# Patient Record
Sex: Male | Born: 1960 | Race: White | Hispanic: No | Marital: Single | Smoking: Never smoker
Health system: Southern US, Community
[De-identification: ages and names within clinical notes are randomized; demographics above are authoritative.]

## PROBLEM LIST (undated history)

## (undated) DIAGNOSIS — R519 Headache, unspecified: Secondary | ICD-10-CM

## (undated) DIAGNOSIS — J309 Allergic rhinitis, unspecified: Secondary | ICD-10-CM

## (undated) DIAGNOSIS — E119 Type 2 diabetes mellitus without complications: Secondary | ICD-10-CM

## (undated) DIAGNOSIS — D62 Acute posthemorrhagic anemia: Secondary | ICD-10-CM

## (undated) DIAGNOSIS — K648 Other hemorrhoids: Secondary | ICD-10-CM

## (undated) DIAGNOSIS — IMO0001 Reserved for inherently not codable concepts without codable children: Secondary | ICD-10-CM

## (undated) DIAGNOSIS — K5730 Diverticulosis of large intestine without perforation or abscess without bleeding: Secondary | ICD-10-CM

## (undated) DIAGNOSIS — R739 Hyperglycemia, unspecified: Secondary | ICD-10-CM

## (undated) DIAGNOSIS — R51 Headache: Secondary | ICD-10-CM

## (undated) DIAGNOSIS — R030 Elevated blood-pressure reading, without diagnosis of hypertension: Secondary | ICD-10-CM

## (undated) HISTORY — DX: Acute posthemorrhagic anemia: D62

## (undated) HISTORY — DX: Elevated blood-pressure reading, without diagnosis of hypertension: R03.0

## (undated) HISTORY — DX: Other hemorrhoids: K64.8

## (undated) HISTORY — DX: Allergic rhinitis, unspecified: J30.9

## (undated) HISTORY — DX: Reserved for inherently not codable concepts without codable children: IMO0001

## (undated) HISTORY — DX: Type 2 diabetes mellitus without complications: E11.9

## (undated) HISTORY — DX: Hyperglycemia, unspecified: R73.9

## (undated) HISTORY — DX: Headache, unspecified: R51.9

## (undated) HISTORY — DX: Diverticulosis of large intestine without perforation or abscess without bleeding: K57.30

## (undated) HISTORY — DX: Headache: R51

## (undated) HISTORY — PX: ESOPHAGOGASTRODUODENOSCOPY: SHX1529

---

## 2006-07-01 HISTORY — PX: COLONOSCOPY: SHX174

## 2007-04-14 ENCOUNTER — Ambulatory Visit: Payer: Self-pay | Admitting: Gastroenterology

## 2007-04-14 LAB — HM COLONOSCOPY

## 2007-04-22 DIAGNOSIS — F17200 Nicotine dependence, unspecified, uncomplicated: Secondary | ICD-10-CM | POA: Insufficient documentation

## 2007-04-22 DIAGNOSIS — K5730 Diverticulosis of large intestine without perforation or abscess without bleeding: Secondary | ICD-10-CM | POA: Insufficient documentation

## 2007-09-10 ENCOUNTER — Other Ambulatory Visit: Payer: Self-pay

## 2007-09-10 ENCOUNTER — Inpatient Hospital Stay: Payer: Self-pay | Admitting: Internal Medicine

## 2007-09-17 DIAGNOSIS — K260 Acute duodenal ulcer with hemorrhage: Secondary | ICD-10-CM | POA: Insufficient documentation

## 2007-09-17 DIAGNOSIS — D62 Acute posthemorrhagic anemia: Secondary | ICD-10-CM | POA: Insufficient documentation

## 2007-11-24 ENCOUNTER — Ambulatory Visit: Payer: Self-pay | Admitting: Unknown Physician Specialty

## 2010-04-14 ENCOUNTER — Emergency Department: Payer: Self-pay | Admitting: Emergency Medicine

## 2010-07-01 HISTORY — PX: LUMBAR EPIDURAL INJECTION: SHX1980

## 2010-09-25 ENCOUNTER — Ambulatory Visit: Payer: Self-pay

## 2010-10-23 ENCOUNTER — Ambulatory Visit: Payer: Self-pay | Admitting: Rheumatology

## 2010-11-14 ENCOUNTER — Ambulatory Visit: Payer: Self-pay | Admitting: Pain Medicine

## 2010-11-22 ENCOUNTER — Ambulatory Visit: Payer: Self-pay | Admitting: Pain Medicine

## 2010-12-17 ENCOUNTER — Ambulatory Visit: Payer: Self-pay | Admitting: Pain Medicine

## 2011-01-08 ENCOUNTER — Ambulatory Visit: Payer: Self-pay | Admitting: Pain Medicine

## 2011-05-16 ENCOUNTER — Ambulatory Visit: Payer: Self-pay | Admitting: Pain Medicine

## 2011-06-03 ENCOUNTER — Ambulatory Visit: Payer: Self-pay | Admitting: Pain Medicine

## 2015-10-30 ENCOUNTER — Ambulatory Visit (INDEPENDENT_AMBULATORY_CARE_PROVIDER_SITE_OTHER): Payer: BLUE CROSS/BLUE SHIELD | Admitting: Family Medicine

## 2015-10-30 ENCOUNTER — Encounter: Payer: Self-pay | Admitting: Family Medicine

## 2015-10-30 VITALS — BP 144/84 | HR 96 | Temp 98.6°F | Resp 20

## 2015-10-30 DIAGNOSIS — Z8719 Personal history of other diseases of the digestive system: Secondary | ICD-10-CM | POA: Diagnosis not present

## 2015-10-30 DIAGNOSIS — E119 Type 2 diabetes mellitus without complications: Secondary | ICD-10-CM | POA: Insufficient documentation

## 2015-10-30 DIAGNOSIS — J301 Allergic rhinitis due to pollen: Secondary | ICD-10-CM

## 2015-10-30 DIAGNOSIS — K5790 Diverticulosis of intestine, part unspecified, without perforation or abscess without bleeding: Secondary | ICD-10-CM | POA: Insufficient documentation

## 2015-10-30 DIAGNOSIS — J0120 Acute ethmoidal sinusitis, unspecified: Secondary | ICD-10-CM

## 2015-10-30 DIAGNOSIS — J309 Allergic rhinitis, unspecified: Secondary | ICD-10-CM | POA: Insufficient documentation

## 2015-10-30 DIAGNOSIS — J45909 Unspecified asthma, uncomplicated: Secondary | ICD-10-CM | POA: Diagnosis not present

## 2015-10-30 DIAGNOSIS — Z8639 Personal history of other endocrine, nutritional and metabolic disease: Secondary | ICD-10-CM | POA: Diagnosis not present

## 2015-10-30 MED ORDER — AMOXICILLIN-POT CLAVULANATE 875-125 MG PO TABS: 1.0000 | ORAL_TABLET | Freq: Two times a day (BID) | ORAL | Status: DC

## 2015-10-30 MED ORDER — BECLOMETHASONE DIPROPIONATE 40 MCG/ACT IN AERS: 2.0000 | INHALATION_SPRAY | Freq: Two times a day (BID) | RESPIRATORY_TRACT | Status: DC

## 2015-10-30 NOTE — Patient Instructions (Signed)
Discussed use of Mucinex D for congestion and Delsym for cough. 

## 2015-10-30 NOTE — Progress Notes (Signed)
Subjective:     Patient ID: Dean JensenJohn L Valentine, male   DOB: 07-26-1960, 55 y.o.   MRN: [id number]  HPI  Chief Complaint  Patient presents with  . Cough    X 1 week. Patient reports that he felt feverish on the first day. Patient reports that he as a lot of PND and sore throat. He has been taking Benadryl, Sudafed, and OTC cough syrup with no relief. Patient reports that he also had a breathing treatment with no relief. He reports that last night his cough was at its worse.   Patient reports increased sinus pressure, purulent sinus drainage, post nasal drainage and accompanying cough. Reports childhood hx of asthma with exposure to smoke and fumes at work.   Review of Systems     Objective:   Physical Exam  Constitutional: He appears well-developed and well-nourished. No distress.  Ears: T.M's intact without inflammation Sinuses: mild left paranasal tenderness Throat: no tonsillar enlargement or exudate Neck: no cervical adenopathy Lungs: bilateral posterior expiratory wheezes.     Assessment:    1. Acute ethmoidal sinusitis, recurrence not specified - amoxicillin-clavulanate (AUGMENTIN) 875-125 MG tablet; Take 1 tablet by mouth 2 (two) times daily.  Dispense: 20 tablet; Refill: 0  2. Mild reactive airways disease: Patient defers oral prednisone due to insomnia side effect. - beclomethasone (QVAR) 40 MCG/ACT inhaler; Inhale 2 puffs into the lungs 2 (two) times daily.  Dispense: 1 Inhaler; Refill: 1    Plan:    Discussed otc medication.

## 2016-07-16 ENCOUNTER — Ambulatory Visit (INDEPENDENT_AMBULATORY_CARE_PROVIDER_SITE_OTHER): Payer: BLUE CROSS/BLUE SHIELD | Admitting: Family Medicine

## 2016-07-16 ENCOUNTER — Encounter: Payer: Self-pay | Admitting: Family Medicine

## 2016-07-16 VITALS — BP 134/90 | HR 103 | Temp 98.5°F | Resp 17 | Wt 292.4 lb

## 2016-07-16 DIAGNOSIS — J069 Acute upper respiratory infection, unspecified: Secondary | ICD-10-CM

## 2016-07-16 DIAGNOSIS — B9789 Other viral agents as the cause of diseases classified elsewhere: Secondary | ICD-10-CM | POA: Diagnosis not present

## 2016-07-16 DIAGNOSIS — R030 Elevated blood-pressure reading, without diagnosis of hypertension: Secondary | ICD-10-CM | POA: Insufficient documentation

## 2016-07-16 MED ORDER — HYDROCODONE-HOMATROPINE 5-1.5 MG/5ML PO SYRP: ORAL_SOLUTION | ORAL | 0 refills | Status: DC

## 2016-07-16 NOTE — Progress Notes (Signed)
Subjective:     Patient ID: Dean Valentine, male   DOB: [birth year], 56 y.o.   MRN: [id number]  HPI  Chief Complaint  Patient presents with  . URI    Patient comes in office today with concerns of cold like symptoms since 07/13/16. Patient states he has the following symptoms, sore throat, productive cough, congestion and chills. Patient has taken otc Mucinex and Benadryl.   No fever reported.   Review of Systems     Objective:   Physical Exam  Constitutional: He appears well-developed and well-nourished. No distress.  Ears: T.M's intact without inflammation Throat: no tonsillar enlargement or exudate Neck: no cervical adenopathy Lungs: clear     Assessment:    1. Viral upper respiratory tract infection - HYDROcodone-homatropine (HYCODAN) 5-1.5 MG/5ML syrup; 5 ml 4-6 hours as needed for cough  Dispense: 240 mL; Refill: 0    Plan:    Discussed use of Mucinex D.Call at end of week if not improving.

## 2016-07-16 NOTE — Patient Instructions (Signed)
Discussed use of Mucinex D. Let me know if sinuses not improving Friday.

## 2016-07-19 ENCOUNTER — Telehealth: Payer: Self-pay | Admitting: Family Medicine

## 2016-07-19 ENCOUNTER — Other Ambulatory Visit: Payer: Self-pay | Admitting: Family Medicine

## 2016-07-19 DIAGNOSIS — J019 Acute sinusitis, unspecified: Secondary | ICD-10-CM

## 2016-07-19 MED ORDER — AMOXICILLIN-POT CLAVULANATE 875-125 MG PO TABS: 1.0000 | ORAL_TABLET | Freq: Two times a day (BID) | ORAL | 0 refills | Status: DC

## 2016-07-19 NOTE — Telephone Encounter (Signed)
Seen in office 07/16/16 diagnosed with Viral URI prescribed Hycodan, please review note and advise. KW

## 2016-07-19 NOTE — Telephone Encounter (Signed)
Patient reports increased purulent sinus drainage with PND and productive cough. Will send in Augmentin to cover for sinusitis

## 2016-07-19 NOTE — Telephone Encounter (Signed)
Pt was in Tuesday and was seen for a cough and sore throat.   He is calling back saying he isn't better and would like an antibiotic  [facility]   Thanks Teri

## 2016-08-16 ENCOUNTER — Encounter: Payer: Self-pay | Admitting: Family Medicine

## 2016-08-16 ENCOUNTER — Ambulatory Visit (INDEPENDENT_AMBULATORY_CARE_PROVIDER_SITE_OTHER): Payer: BLUE CROSS/BLUE SHIELD | Admitting: Family Medicine

## 2016-08-16 VITALS — BP 164/108 | HR 96 | Temp 98.2°F | Resp 20 | Wt 296.0 lb

## 2016-08-16 DIAGNOSIS — M5126 Other intervertebral disc displacement, lumbar region: Secondary | ICD-10-CM | POA: Diagnosis not present

## 2016-08-16 MED ORDER — METHOCARBAMOL 750 MG PO TABS: ORAL_TABLET | ORAL | 1 refills | Status: DC

## 2016-08-16 NOTE — Progress Notes (Signed)
Subjective:     Patient ID: Dean JensenJohn L Valentine, male   DOB: Apr 22, 1961, 56 y.o.   MRN: [id number]  HPI  Chief Complaint  Patient presents with  . Back Pain    Pt reports he has a herniated L4-5. Pt reporst he aggravated the pain yesterday while working on a bulldozer. Pt rates the pain as 10/10. Pt was being followed by [facility], but he has not been since before 2011. Pt is requesting a muscle relaxer, Robaxin. He was on this previously, with relief. Pt has tried Tylenol, without relief.  States he does not need a work excuse.Defers prednisone due to side effect of inability to sleep. Does not request narcotic medication.   Review of Systems     Objective:   Physical Exam  Constitutional: He appears well-developed and well-nourished. He appears distressed (moderate back pain).  Musculoskeletal:  Muscle strength in lower extremities 5/5. SLR's to 90 degrees without radiation of back pain. Localizes pain to right buttock area.       Assessment:    1. 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

## 2016-08-16 NOTE — Patient Instructions (Signed)
Discussed use of tylenol up to 3000 mg/day.

## 2017-03-08 DIAGNOSIS — R739 Hyperglycemia, unspecified: Secondary | ICD-10-CM | POA: Diagnosis not present

## 2017-03-08 DIAGNOSIS — M65871 Other synovitis and tenosynovitis, right ankle and foot: Secondary | ICD-10-CM | POA: Diagnosis not present

## 2017-03-10 ENCOUNTER — Ambulatory Visit (INDEPENDENT_AMBULATORY_CARE_PROVIDER_SITE_OTHER): Payer: BLUE CROSS/BLUE SHIELD | Admitting: Family Medicine

## 2017-03-10 ENCOUNTER — Encounter: Payer: Self-pay | Admitting: Family Medicine

## 2017-03-10 VITALS — BP 140/110 | HR 98 | Temp 98.6°F | Resp 17 | Ht 70.0 in | Wt 277.4 lb

## 2017-03-10 DIAGNOSIS — Z13220 Encounter for screening for lipoid disorders: Secondary | ICD-10-CM | POA: Diagnosis not present

## 2017-03-10 DIAGNOSIS — E119 Type 2 diabetes mellitus without complications: Secondary | ICD-10-CM | POA: Diagnosis not present

## 2017-03-10 DIAGNOSIS — M5126 Other intervertebral disc displacement, lumbar region: Secondary | ICD-10-CM | POA: Diagnosis not present

## 2017-03-10 DIAGNOSIS — S99921A Unspecified injury of right foot, initial encounter: Secondary | ICD-10-CM | POA: Diagnosis not present

## 2017-03-10 LAB — COMPREHENSIVE METABOLIC PANEL
AG Ratio: 1.5 (calc) (ref 1.0–2.5)
ALT: 17 U/L (ref 9–46)
AST: 15 U/L (ref 10–35)
Albumin: 4 g/dL (ref 3.6–5.1)
Alkaline phosphatase (APISO): 109 U/L (ref 40–115)
BILIRUBIN TOTAL: 1.6 mg/dL — AB (ref 0.2–1.2)
BUN: 15 mg/dL (ref 7–25)
CALCIUM: 8.9 mg/dL (ref 8.6–10.3)
CO2: 25 mmol/L (ref 20–32)
Chloride: 101 mmol/L (ref 98–110)
Creat: 0.86 mg/dL (ref 0.70–1.33)
GLUCOSE: 307 mg/dL — AB (ref 65–99)
Globulin: 2.6 g/dL (calc) (ref 1.9–3.7)
Potassium: 4.8 mmol/L (ref 3.5–5.3)
SODIUM: 136 mmol/L (ref 135–146)
TOTAL PROTEIN: 6.6 g/dL (ref 6.1–8.1)

## 2017-03-10 LAB — LIPID PANEL
Cholesterol: 205 mg/dL — ABNORMAL HIGH (ref <200)
HDL: 48 mg/dL (ref >40)
LDL CHOLESTEROL (CALC): 132 mg/dL — AB
Non-HDL Cholesterol (Calc): 157 mg/dL (calc) — ABNORMAL HIGH (ref <130)
TRIGLYCERIDES: 141 mg/dL (ref <150)
Total CHOL/HDL Ratio: 4.3 (calc) (ref <5.0)

## 2017-03-10 MED ORDER — METFORMIN HCL 500 MG PO TABS: 500.0000 mg | ORAL_TABLET | Freq: Two times a day (BID) | ORAL | 3 refills | Status: DC

## 2017-03-10 MED ORDER — METHOCARBAMOL 750 MG PO TABS: ORAL_TABLET | ORAL | 1 refills | Status: DC

## 2017-03-10 NOTE — Patient Instructions (Signed)
Stop sweet drinks and concentrated sweets. Let me know if you can't tolerate the metformin.

## 2017-03-10 NOTE — Progress Notes (Signed)
Subjective:     Patient ID: Dean Valentine, male   DOB: Feb 09, 1961, 56 y.o.   MRN: [id number]  HPI  Chief Complaint  Patient presents with  . Transitions Of Care    Patient comes in offie today to follow up from Urgent care visit on 03/08/17. Patient states that he was seen at [facility] on 03/08/17 with complaints of right foot pain after injurying foot two days prior at home, patient reports that x-ray showed no fracture. Patient states that his sugar level was checked at urgent care and glucose was 450 with a HgbA1C > 11%.   States he injured his foot when he tripped on his dog at home. He is sore both in his lateral ankle, dorsum and plantar arch area. Treated with oxycodone and and AFO. Diagnosed in 2012 with diabetes but was not on medication at that time. Reports urinary frequency and increased thirst. Consumes 64 oz. Of soft drinks daily but stopped when told his sugar was elevated.   Review of Systems     Objective:   Physical Exam  Constitutional: He appears well-developed and well-nourished. No distress.  Cardiovascular:  Pulses:      Dorsalis pedis pulses are 2+ on the right side.       Posterior tibial pulses are 2+ on the right side.  Musculoskeletal:  Ankle ligaments stable but increased lateral malleolar pain with testing. Tender over mid- plantar aspect of his foot. Mild swelling without pitting over dorsum of his foot.       Assessment:    1. Foot injury, right, initial encounter - Ambulatory referral to Podiatry  2. Diabetes mellitus without complication (HCC) - metFORMIN (GLUCOPHAGE) 500 MG tablet; Take 1 tablet (500 mg total) by mouth 2 (two) times daily with a meal.  Dispense: 180 tablet; Refill: 3 - Comprehensive metabolic panel  3. Screening for cholesterol level - Lipid panel  4. 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 Further f/u in 2 weeks.

## 2017-03-11 ENCOUNTER — Ambulatory Visit (INDEPENDENT_AMBULATORY_CARE_PROVIDER_SITE_OTHER): Payer: BLUE CROSS/BLUE SHIELD

## 2017-03-11 ENCOUNTER — Ambulatory Visit (INDEPENDENT_AMBULATORY_CARE_PROVIDER_SITE_OTHER): Payer: BLUE CROSS/BLUE SHIELD | Admitting: Podiatry

## 2017-03-11 ENCOUNTER — Encounter: Payer: Self-pay | Admitting: Podiatry

## 2017-03-11 DIAGNOSIS — M659 Synovitis and tenosynovitis, unspecified: Secondary | ICD-10-CM | POA: Diagnosis not present

## 2017-03-11 DIAGNOSIS — M7671 Peroneal tendinitis, right leg: Secondary | ICD-10-CM

## 2017-03-11 DIAGNOSIS — M722 Plantar fascial fibromatosis: Secondary | ICD-10-CM | POA: Diagnosis not present

## 2017-03-11 DIAGNOSIS — S99921A Unspecified injury of right foot, initial encounter: Secondary | ICD-10-CM | POA: Diagnosis not present

## 2017-03-11 DIAGNOSIS — M65971 Unspecified synovitis and tenosynovitis, right ankle and foot: Secondary | ICD-10-CM

## 2017-03-11 MED ORDER — NONFORMULARY OR COMPOUNDED ITEM: 2 refills | Status: DC

## 2017-03-11 NOTE — Progress Notes (Signed)
   Subjective:    Patient ID: Dean JensenJohn L Valentine, male    DOB: [birth year], 56 y.o.   MRN: [id number]  HPI    Review of Systems  All other systems reviewed and are negative.      Objective:   Physical Exam        Assessment & Plan:

## 2017-03-14 MED ORDER — BETAMETHASONE SOD PHOS & ACET 6 (3-3) MG/ML IJ SUSP: 3.0000 mg | Freq: Once | INTRAMUSCULAR | Status: AC

## 2017-03-14 NOTE — Progress Notes (Signed)
   Subjective: Patient is a 56 year old male with PMHx of DM presenting today with a complaint of pain and tenderness in the lateral right foot, posterior right heel and right arch that began 6 days ago. He states he heard a popping sound from the arch. He also reports a discolored, thickened left great toenail. He has not done anything to treat his symptoms. Patient presents today for further treatment and evaluation.  Objective: Physical Exam General: The patient is alert and oriented x3 in no acute distress.  Dermatology: Skin is warm, dry and supple bilateral lower extremities. Negative for open lesions or macerations bilateral.   Vascular: Dorsalis Pedis and Posterior Tibial pulses palpable bilateral.  Capillary fill time is immediate to all digits.  Neurological: Epicritic and protective threshold intact bilateral.   Musculoskeletal: Tenderness to palpation at the medial calcaneal tubercale and through the insertion of the plantar fascia of the right foot and right ankle joint. All other joints range of motion within normal limits bilateral. Strength 5/5 in all groups bilateral.   Radiographic exam: Normal osseous mineralization. Joint spaces preserved. No fracture/dislocation/boney destruction. Calcaneal spur present with mild thickening of plantar fascia right. No other soft tissue abnormalities or radiopaque foreign bodies.   Assessment: 1. Plantar fasciitis right 2. Insertional peroneal tendinitis right 3. Synovitis right ankle  Plan of Care:  1. Patient evaluated. Xrays reviewed.   2. Injection of 0.5cc Celestone soluspan injected into the right plantar fascia . 3. Injection of 0.5 mLs Celestone Soluspan injected into the insertion of the peroneal tendon of the right foot. 4. Injection of 0.5 mLs Celestone Soluspan injected into right ankle joint. 5. Cannot tolerate NSAIDs. H/o stomach ulcers. 6. CAM boot dispensed. Weightbearing as tolerated.  7. Pain cream to be dispensed  from [facility] pharmacy.  8. Return to clinic in 4 weeks.     Felecia Shelling, DPM [facility]  Dr. Felecia Shelling, DPM    [address] [place], [place] [address]                Office [phone]  Fax [phone]

## 2017-03-24 ENCOUNTER — Encounter: Payer: Self-pay | Admitting: Family Medicine

## 2017-03-24 ENCOUNTER — Ambulatory Visit (INDEPENDENT_AMBULATORY_CARE_PROVIDER_SITE_OTHER): Payer: BLUE CROSS/BLUE SHIELD | Admitting: Family Medicine

## 2017-03-24 VITALS — BP 142/100 | HR 122 | Temp 98.7°F | Resp 16 | Wt 276.4 lb

## 2017-03-24 DIAGNOSIS — Z23 Encounter for immunization: Secondary | ICD-10-CM | POA: Diagnosis not present

## 2017-03-24 DIAGNOSIS — E119 Type 2 diabetes mellitus without complications: Secondary | ICD-10-CM

## 2017-03-24 DIAGNOSIS — R030 Elevated blood-pressure reading, without diagnosis of hypertension: Secondary | ICD-10-CM | POA: Diagnosis not present

## 2017-03-24 LAB — GLUCOSE, POCT (MANUAL RESULT ENTRY): POC Glucose: 171 mg/dl — AB (ref 70–99)

## 2017-03-24 MED ORDER — GLUCOSE BLOOD VI STRP: ORAL_STRIP | 5 refills | Status: AC

## 2017-03-24 NOTE — Progress Notes (Signed)
Subjective:     Patient ID: Dean Valentine, male   DOB: [birth year], 56 y.o.   MRN: [id number]  HPI  Chief Complaint  Patient presents with  . Diabetes    Patient returns to office today for follow up from 03/10/17, at last visit patient was started on Metfomin  BID. Patient states that he has changed his diet and is less thirsty as he use to be, he reports good compliance and tolerance on medication.   Wishes to get a glucometer and we have provided him with a Contour Next with lancets and order for test strips. Reports anxiety coming in to the doctor's office.  Review of Systems     Objective:   Physical Exam  Constitutional: He appears well-developed and well-nourished. No distress.       Assessment:    1. Diabetes mellitus without complication (HCC) - POCT glucose (manual entry) - glucose blood test strip; Check a fasting sugar in the morning and 2 hours after a meal 2 x week.  Dispense: 50 each; Refill: 5  2. Blood pressure elevated without history of HTN  3. Need for influenza vaccination - Flu Vaccine QUAD 36+ mos IM    Plan:    Discussed goals of fasting sugar < 130 and two hour PP of < 180. He does not wish to attend diabetic classes at this time. CMA, Georgiann Hahn, spent time showing him how to use the glucometer.

## 2017-03-24 NOTE — Patient Instructions (Signed)
Sugar goal is < 130 in the  AM and < 180 two hours after eating a meal.

## 2017-04-11 ENCOUNTER — Ambulatory Visit: Payer: BLUE CROSS/BLUE SHIELD | Admitting: Podiatry

## 2017-04-15 ENCOUNTER — Encounter: Payer: Self-pay | Admitting: Podiatry

## 2017-04-15 ENCOUNTER — Ambulatory Visit (INDEPENDENT_AMBULATORY_CARE_PROVIDER_SITE_OTHER): Payer: BLUE CROSS/BLUE SHIELD | Admitting: Podiatry

## 2017-04-15 DIAGNOSIS — M659 Synovitis and tenosynovitis, unspecified: Secondary | ICD-10-CM

## 2017-04-15 DIAGNOSIS — M7671 Peroneal tendinitis, right leg: Secondary | ICD-10-CM

## 2017-04-15 DIAGNOSIS — M722 Plantar fascial fibromatosis: Secondary | ICD-10-CM | POA: Diagnosis not present

## 2017-04-16 NOTE — Progress Notes (Signed)
   Subjective: Patient is a Dean Valentine year old male with PMHx of DM presenting today for follow-up evaluation of right plantar fasciitis, right ankle synovitis and insertional peroneal tendinitis of the right foot. He states his plantar fasciitis is improving. He reports continued pain in his Achilles. He states the injection helped alleviate the pain. Patient presents today for further treatment and evaluation.   Past Medical History:  Diagnosis Date  . Acute posthemorrhagic anemia   . Allergic rhinitis   . Diabetes mellitus without complication (HCC)   . Diverticulosis of colon without hemorrhage   . Elevated blood pressure   . Headache   . Hyperglycemia   . Internal hemorrhoid     Objective: Physical Exam General: The patient is alert and oriented x3 in no acute distress.  Dermatology: Skin is warm, dry and supple bilateral lower extremities. Negative for open lesions or macerations bilateral.   Vascular: Dorsalis Pedis and Posterior Tibial pulses palpable bilateral.  Capillary fill time is immediate to all digits.  Neurological: Epicritic and protective threshold intact bilateral.   Musculoskeletal: Tenderness to palpation at the medial calcaneal tubercale and through the insertion of the plantar fascia of the right foot and right ankle joint. All other joints range of motion within normal limits bilateral. Strength 5/5 in all groups bilateral.    Assessment: 1. Plantar fasciitis right 2. Insertional peroneal tendinitis right 3. Synovitis right ankle  Plan of Care:  1. Patient evaluated.   2. Injection of 0.5cc Celestone soluspan injected into the right plantar fascia . 3. Injection of 0.5 mLs Celestone Soluspan injected into the insertion of the peroneal tendons of the right foot. 4. Injection of 0.5 mLs Celestone Soluspan injected into right ankle joint. 5. Ankle brace dispensed. 6. Requested pre-authorization for custom molded orthotics. [facility]. 7. Return to  clinic in 4 weeks.  Goes by Dean Valentine.     Felecia ShellingBrent M. Kollyns Mickelson, DPM [facility]  Dr. Felecia ShellingBrent M. Armoni Kludt, DPM    [address]                                        [place], [place] [address]                Office [phone]  Fax [phone]

## 2017-04-24 MED ORDER — BETAMETHASONE SOD PHOS & ACET 6 (3-3) MG/ML IJ SUSP: 3.0000 mg | Freq: Once | INTRAMUSCULAR | Status: AC

## 2017-05-13 ENCOUNTER — Ambulatory Visit: Payer: BLUE CROSS/BLUE SHIELD | Admitting: Podiatry

## 2017-05-13 ENCOUNTER — Encounter: Payer: Self-pay | Admitting: Podiatry

## 2017-05-13 DIAGNOSIS — L989 Disorder of the skin and subcutaneous tissue, unspecified: Secondary | ICD-10-CM | POA: Diagnosis not present

## 2017-05-13 DIAGNOSIS — M7671 Peroneal tendinitis, right leg: Secondary | ICD-10-CM

## 2017-05-13 DIAGNOSIS — M65972 Unspecified synovitis and tenosynovitis, left ankle and foot: Secondary | ICD-10-CM

## 2017-05-13 DIAGNOSIS — M659 Synovitis and tenosynovitis, unspecified: Secondary | ICD-10-CM

## 2017-05-13 DIAGNOSIS — M65971 Unspecified synovitis and tenosynovitis, right ankle and foot: Secondary | ICD-10-CM

## 2017-05-16 NOTE — Progress Notes (Signed)
   Subjective: Patient is a 56 year old male with PMHx of DM presenting today for follow-up evaluation of right foot and ankle pain. He reports continued pain to the dorsum and lateral side of the right foot. He also has a new complaint of a painful callus on the plantar aspect of the right foot beneath the fifth toe. Patient presents today for further treatment and evaluation.   Past Medical History:  Diagnosis Date  . Acute posthemorrhagic anemia   . Allergic rhinitis   . Diabetes mellitus without complication (HCC)   . Diverticulosis of colon without hemorrhage   . Elevated blood pressure   . Headache   . Hyperglycemia   . Internal hemorrhoid     Objective: Physical Exam General: The patient is alert and oriented x3 in no acute distress.  Dermatology: Hyperkeratotic lesion present on bilateral feet x 2. Pain on palpation with a central nucleated core noted. Skin is warm, dry and supple bilateral lower extremities. Negative for open lesions or macerations bilateral.   Vascular: Dorsalis Pedis and Posterior Tibial pulses palpable bilateral.  Capillary fill time is immediate to all digits.  Neurological: Epicritic and protective threshold intact bilateral.   Musculoskeletal: Pain with palpation to the anterior lateral medial aspects of the patient's bilateral ankles. Mild edema noted. Pain with palpation to the peroneal tendons of the right foot.  No pain with palpation noted to the medial calcaneal tubercale or through the insertion of the plantar fascia of the right foot. All other joints range of motion within normal limits bilateral. Strength 5/5 in all groups bilateral.    Assessment: 1. Plantar fasciitis right - resolved 2. Insertional peroneal tendinitis right 3. Synovitis bilateral ankles 4. Porokeratosis bilateral feet x 2  Plan of Care:  1. Patient evaluated.   2. Injection of 0.5cc Celestone soluspan injected into bilateral ankle joints. 3. Injection of 0.5 mLs  Celestone Soluspan injected into the insertion of the peroneal tendons of the right foot. 4. Continue wearing good shoe gear. 5. Cannot tolerate oral NSAIDs. 6. Excisional debridement of keratoic lesion using a chisel blade was performed without incident.  7. Dressed with light dressing. 8. Return to clinic in 8 weeks.   Goes by Dean Valentine.     Felecia ShellingBrent M. Evans, DPM [facility]  Dr. Felecia ShellingBrent M. Evans, DPM    [address]                                        [place], [place] [address]                Office [phone]  Fax [phone]

## 2017-07-03 ENCOUNTER — Ambulatory Visit: Payer: BLUE CROSS/BLUE SHIELD | Admitting: Family Medicine

## 2017-07-03 ENCOUNTER — Encounter: Payer: Self-pay | Admitting: Family Medicine

## 2017-07-03 VITALS — BP 140/90 | HR 84 | Temp 98.4°F | Resp 16 | Wt 262.8 lb

## 2017-07-03 DIAGNOSIS — R030 Elevated blood-pressure reading, without diagnosis of hypertension: Secondary | ICD-10-CM | POA: Diagnosis not present

## 2017-07-03 DIAGNOSIS — E119 Type 2 diabetes mellitus without complications: Secondary | ICD-10-CM | POA: Diagnosis not present

## 2017-07-03 LAB — POCT GLYCOSYLATED HEMOGLOBIN (HGB A1C): Hemoglobin A1C: 7.2

## 2017-07-03 MED ORDER — METFORMIN HCL 1000 MG PO TABS: 1000.0000 mg | ORAL_TABLET | Freq: Two times a day (BID) | ORAL | 0 refills | Status: DC

## 2017-07-03 NOTE — Patient Instructions (Signed)
Continue lifestyle changes and add exercise (walking 30 minutes daily).

## 2017-07-03 NOTE — Progress Notes (Signed)
Subjective:     Patient ID: Dean Valentine, male   DOB: [birth year], 57 y.o.   MRN: [id number] Chief Complaint  Patient presents with  . Diabetes    Patient returns for follow up visit, patient was last seen 03/24/17 glucose in house was 171. Patient states that he has been working on his diet and has had ghood compliance and tolerance on medication. Patient reports that his most recent high blood sugar reading was 258 and low 140.  Marland Kitchen. Hypertension    Patient returns for follow up from 03/24/17 blood pressure at visit was 142/100   HPI States he has paid strict attention to his diet and has lost 14# since prior visit. Reports tolerance of metformin. 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 Continues to be followed by podiatry for foot/ankle issues. Review of Systems  Musculoskeletal: Back pain: has had discrete episodes of right sided back pain "which nearly brought him to his knees" No hx of kidney stones        Objective:   Physical Exam  Constitutional: He appears well-developed and well-nourished. No distress.  Cardiovascular: Normal rate and regular rhythm.  Pulmonary/Chest: Breath sounds normal.  Musculoskeletal: He exhibits no edema (of lower extremties.).       Assessment:    1. Diabetes mellitus without complication [facility](HCC): will increase for improved control. - POCT glycosylated hemoglobin (Hb A1C) - metFORMIN (GLUCOPHAGE) 1000 MG tablet; Take 1 tablet (1,000 mg total) by mouth 2 (two) times daily with a meal.  Dispense: 180 tablet; Refill: 0  2. Elevated blood pressure reading: improving with weight loss    Plan:    Discussed possibility of placement on cholesterol medication and ACE if bp not under improved control. F/u in 3 months.

## 2017-07-24 ENCOUNTER — Encounter: Payer: Self-pay | Admitting: Family Medicine

## 2017-07-24 ENCOUNTER — Telehealth: Payer: Self-pay | Admitting: Family Medicine

## 2017-07-24 ENCOUNTER — Ambulatory Visit: Payer: BLUE CROSS/BLUE SHIELD | Admitting: Family Medicine

## 2017-07-24 VITALS — BP 122/90 | HR 108 | Temp 100.0°F | Resp 96 | Wt 255.6 lb

## 2017-07-24 DIAGNOSIS — J101 Influenza due to other identified influenza virus with other respiratory manifestations: Secondary | ICD-10-CM

## 2017-07-24 LAB — POC INFLUENZA A&B (BINAX/QUICKVUE)
INFLUENZA B, POC: NEGATIVE
Influenza A, POC: POSITIVE — AB

## 2017-07-24 MED ORDER — HYDROCODONE-HOMATROPINE 5-1.5 MG/5ML PO SYRP: ORAL_SOLUTION | ORAL | 0 refills | Status: DC

## 2017-07-24 NOTE — Progress Notes (Signed)
Subjective:     Patient ID: Dean Valentine, male   DOB: 07/12/1960, 57 y.o.   MRN: [id number] Chief Complaint  Patient presents with  . Sinus Problem    Patient comes in office today with complaints of sinus pain and pressure on the right side of his face for the past 3 days. Patient reports cough with pain in his chest, runny nose, sinus pain above eyes, body aches, hot/cold chills and post nasal drip. Patient has tried taking otc Allergy tablets and Tussin DM.   HPI Reports flu-like symptoms over the last 4 days. Did not take temperature at home. + flu shot this season.  Review of Systems     Objective:   Physical Exam  Constitutional: He appears well-developed and well-nourished. He has a sickly appearance.  Ears: T.M's intact without inflammation Throat: no tonsillar enlargement or exudate Neck: anterior cervical tenderness Lungs: scattered inspiratory/expiratory wheezes     Assessment:    1. Influenza A - POC Influenza A&B(BINAX/QUICKVUE) - HYDROcodone-homatropine (HYCODAN) 5-1.5 MG/5ML syrup; 5 ml 4-6 hours as needed for cough  Dispense: 120 mL; Refill: 0    Plan:   Discussed otc medication and work excuse from 1/22-1/27/19. Mask provided.

## 2017-07-24 NOTE — Telephone Encounter (Signed)
Discussed Tamiflu not appropriate 4 days into his illness.

## 2017-07-24 NOTE — Patient Instructions (Signed)
Discussed use of Mucinex D for congestion, Delsym for cough, and Benadryl for postnasal drainage 

## 2017-07-24 NOTE — Telephone Encounter (Signed)
Patient called to say his mother is in the hospital with "2 other flus" and wanted to know if you would call him in some Tamiflu since he has been exposed to his mother with the flu.  He uses [facility] [address]

## 2017-10-02 ENCOUNTER — Ambulatory Visit: Payer: BLUE CROSS/BLUE SHIELD | Admitting: Family Medicine

## 2017-10-02 ENCOUNTER — Encounter: Payer: Self-pay | Admitting: Family Medicine

## 2017-10-02 ENCOUNTER — Other Ambulatory Visit: Payer: Self-pay | Admitting: Family Medicine

## 2017-10-02 VITALS — BP 142/100 | HR 96 | Temp 98.4°F | Resp 16 | Wt 245.8 lb

## 2017-10-02 DIAGNOSIS — E782 Mixed hyperlipidemia: Secondary | ICD-10-CM | POA: Diagnosis not present

## 2017-10-02 DIAGNOSIS — M5126 Other intervertebral disc displacement, lumbar region: Secondary | ICD-10-CM

## 2017-10-02 DIAGNOSIS — E119 Type 2 diabetes mellitus without complications: Secondary | ICD-10-CM | POA: Diagnosis not present

## 2017-10-02 DIAGNOSIS — R030 Elevated blood-pressure reading, without diagnosis of hypertension: Secondary | ICD-10-CM | POA: Diagnosis not present

## 2017-10-02 LAB — POCT GLYCOSYLATED HEMOGLOBIN (HGB A1C): HEMOGLOBIN A1C: 6.2

## 2017-10-02 MED ORDER — METHOCARBAMOL 750 MG PO TABS: ORAL_TABLET | ORAL | 1 refills | Status: DC

## 2017-10-02 MED ORDER — METFORMIN HCL 1000 MG PO TABS: 1000.0000 mg | ORAL_TABLET | Freq: Two times a day (BID) | ORAL | 0 refills | Status: DC

## 2017-10-02 NOTE — Patient Instructions (Signed)
We will call you with the lab results. Check your bp outside of the office or just come in for a nurse bp check.

## 2017-10-02 NOTE — Progress Notes (Signed)
Subjective:     Patient ID: Dean JensenJohn L Valentine, male   DOB: [birth year], 57 y.o.   MRN: [id number] Chief Complaint  Patient presents with  . Diabetes    Patient returns to office today for 3 month follow up, patient was seen on 07/03/17 HgbA1C was 7.2%. Patient reports since increasing Metformin he has had good compliance, tolerance and symptom control on medication. Patient states that blood sugars at home have been between 100-110 with a recent high of 130.   Marland Kitchen. Hypertension    Patient comes in office today for follow up from 07/03/17, symptoms was improving with weight loss blood pressure in house was 140/90. Since last visit patient has lost 11lbs.    HPI Reports he continues to make primarily wise food choices but exercise is limited by left degenerative knee symptoms. Defers x-ray at this time. Reports and untoward experience with an unknown blood pressure medication years ago and does not wish to be placed on medication. He is willing to check bp's outside of the office to see if they are lower.  Review of Systems  Respiratory: Negative for shortness of breath.   Cardiovascular: Negative for chest pain and palpitations.       Objective:   Physical Exam  Constitutional: He appears well-developed and well-nourished. No distress.  Lungs: clear Heart: RRR without murmur Lower extremities: no edema; pedal pulses intact, sensation to monofilament intact, no wounds noted. Friction blister on right heel.     Assessment:    1. Blood pressure elevated without history of HTN  2. Diabetes mellitus without complication (HCC) - POCT glycosylated hemoglobin (Hb A1C) - metFORMIN (GLUCOPHAGE) 1000 MG tablet; Take 1 tablet (1,000 mg total) by mouth 2 (two) times daily with a meal.  Dispense: 180 tablet; Refill: 0  3. Mixed hyperlipidemia - Lipid panel  4. 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 Further f/u pending lab results. He is trying to eliminate the need for diabetes medication through dietary choices.

## 2017-11-02 ENCOUNTER — Other Ambulatory Visit: Payer: Self-pay | Admitting: Family Medicine

## 2017-11-02 DIAGNOSIS — E119 Type 2 diabetes mellitus without complications: Secondary | ICD-10-CM

## 2018-01-02 ENCOUNTER — Encounter: Payer: Self-pay | Admitting: Family Medicine

## 2018-01-02 ENCOUNTER — Ambulatory Visit: Payer: BLUE CROSS/BLUE SHIELD | Admitting: Family Medicine

## 2018-01-02 VITALS — BP 130/92 | HR 88 | Temp 98.7°F | Resp 16 | Wt 239.8 lb

## 2018-01-02 DIAGNOSIS — R030 Elevated blood-pressure reading, without diagnosis of hypertension: Secondary | ICD-10-CM

## 2018-01-02 DIAGNOSIS — E119 Type 2 diabetes mellitus without complications: Secondary | ICD-10-CM | POA: Diagnosis not present

## 2018-01-02 LAB — POCT GLYCOSYLATED HEMOGLOBIN (HGB A1C): HEMOGLOBIN A1C: 6 % — AB (ref 4.0–5.6)

## 2018-01-02 MED ORDER — METFORMIN HCL 1000 MG PO TABS: 1000.0000 mg | ORAL_TABLET | Freq: Two times a day (BID) | ORAL | 1 refills | Status: DC

## 2018-01-02 NOTE — Progress Notes (Signed)
  Subjective:     Patient ID: Dean JensenJohn L Valentine, male   DOB: Oct 12, 1960, 57 y.o.   MRN: [id number] Chief Complaint  Patient presents with  . Hypertension    Patient returns for follow up from 10/02/17 blood pressure at last visit was 142/100.  . Diabetes    Patient returns for 3 month follow up, at last visit 10/02/17 HgbA1C was 6.2%. Patient denies symptoms of polydipsia or polyuria, visual changes or changes tos feet for wounds or sores. Patient reports good compliance and tolerance on Metformin.  Marland Kitchen. Hyperlipidemia    3 month follow up from 10/02/17.    HPI Reports he has been trying to make wise food choices and has lost 6# over the last 3 months. He is adamant about not going on a bp medication. Has not updated his lipid profile at this time.  Review of Systems  Respiratory: Negative for shortness of breath.   Cardiovascular: Negative for chest pain and palpitations.       Objective:   Physical Exam  Constitutional: He appears well-developed and well-nourished.  Cardiovascular: Normal rate and regular rhythm.  Pulmonary/Chest: Breath sounds normal.       Assessment:    1. Diabetes mellitus without complication [facility](HCC): well controlled on present medication - POCT glycosylated hemoglobin (Hb A1C) - metFORMIN (GLUCOPHAGE) 1000 MG tablet; Take 1 tablet (1,000 mg total) by mouth 2 (two) times daily with a meal.  Dispense: 180 tablet; Refill: 1  2. Blood pressure elevated without history of HTN: improving with weight loss     Plan:    Encouraged updating lipid profile.

## 2018-01-02 NOTE — Patient Instructions (Signed)
Please try to get the cholesterol panel drawn fasting. Continue with lifestyle changes to keep blood pressure and sugar down.

## 2018-04-06 ENCOUNTER — Encounter: Payer: Self-pay | Admitting: Family Medicine

## 2018-04-06 ENCOUNTER — Ambulatory Visit: Payer: BLUE CROSS/BLUE SHIELD | Admitting: Family Medicine

## 2018-04-06 VITALS — BP 140/100 | HR 92 | Temp 98.5°F | Resp 16 | Wt 244.0 lb

## 2018-04-06 DIAGNOSIS — E782 Mixed hyperlipidemia: Secondary | ICD-10-CM

## 2018-04-06 DIAGNOSIS — E119 Type 2 diabetes mellitus without complications: Secondary | ICD-10-CM

## 2018-04-06 DIAGNOSIS — Z23 Encounter for immunization: Secondary | ICD-10-CM

## 2018-04-06 DIAGNOSIS — R030 Elevated blood-pressure reading, without diagnosis of hypertension: Secondary | ICD-10-CM | POA: Diagnosis not present

## 2018-04-06 DIAGNOSIS — J301 Allergic rhinitis due to pollen: Secondary | ICD-10-CM | POA: Diagnosis not present

## 2018-04-06 LAB — POCT GLYCOSYLATED HEMOGLOBIN (HGB A1C): Hemoglobin A1C: 5.8 % — AB (ref 4.0–5.6)

## 2018-04-06 NOTE — Patient Instructions (Addendum)
Try a steroid nasal spray like Nasonex.

## 2018-04-06 NOTE — Progress Notes (Signed)
  Subjective:     Patient ID: Dean Valentine, male   DOB: [birth year], 57 y.o.   MRN: [id number] Chief Complaint  Patient presents with  . Diabetes    Patient returns to office today for 3 month follow up, patient was last seren 01/02/18 and HgbA1C was 6.0%. Patient reports good compliance and tolerance on medication, he denies any symptoms of polydipswia or polyuria.  . Hypertension    Patient returns to office today for follow up, patient was last seen on 01/02/18 and blood pressure in house was 130/92.    HPI States the metformin constipates him and would like to try to get off medication if possible at next visit. He does not wish to update cholesterol labs or be on a cholesterol medication. He has had adverse side effects in the past to bp medication and does not wish that either. He attributes his elevated bp today to allergy medication and the drive across town.  Review of Systems  Respiratory: Negative for shortness of breath.   Cardiovascular: Negative for chest pain and palpitations.       Objective:   Physical Exam  Constitutional: He appears well-developed and well-nourished. No distress.  Cardiovascular: Normal rate and regular rhythm.  Pulmonary/Chest: Breath sounds normal.  Musculoskeletal: He exhibits no edema ( of distal extremities ).       Assessment:    1. Diabetes mellitus without complication [facility]): well  controlled - POCT glycosylated hemoglobin (Hb A1C)  2. Blood pressure elevated without history of HTN:   3. Mixed hyperlipidemia: no further intervention at this time per patient preference  4. Seasonal allergic rhinitis due to pollen: try steroid nasal spray  5. Need for influenza vaccination    Plan:    Sample of saline irrigation provided.

## 2018-06-08 ENCOUNTER — Encounter: Payer: Self-pay | Admitting: Family Medicine

## 2018-06-08 ENCOUNTER — Ambulatory Visit: Payer: BLUE CROSS/BLUE SHIELD | Admitting: Family Medicine

## 2018-06-08 ENCOUNTER — Other Ambulatory Visit: Payer: Self-pay

## 2018-06-08 VITALS — BP 170/110 | HR 68 | Temp 98.2°F | Ht 70.5 in | Wt 252.0 lb

## 2018-06-08 DIAGNOSIS — J3089 Other allergic rhinitis: Secondary | ICD-10-CM | POA: Diagnosis not present

## 2018-06-08 DIAGNOSIS — R062 Wheezing: Secondary | ICD-10-CM

## 2018-06-08 MED ORDER — PREDNISONE 20 MG PO TABS: ORAL_TABLET | ORAL | 0 refills | Status: DC

## 2018-06-08 NOTE — Patient Instructions (Signed)
Call me Friday about how you did on the prednisone.

## 2018-06-08 NOTE — Progress Notes (Signed)
  Subjective:     Patient ID: Dean Valentine, male   DOB: Dec 19, 1960, 57 y.o.   MRN: [id number] Chief Complaint  Patient presents with  . Sinusitis    still have since Oct 2019 last night was the worst.  SOB, congestion (clear), vomiting because of the mucus.  it triggered a asthma attack  . Cough    zyzall,nasocort, benadryl, albuterol inhaler that he has tried and doesn't seem to be helping   HPI States he been using steroid nasal spray and antihistamines without relief of PND. No sinus congestion. Last night developed chest tightness and burning along with wheezing. States it seemed to be relieved by a family member's albuterol.Does use smokeless tobacco but little caffeine.Denies hearburn.  Review of Systems     Objective:   Physical Exam  Constitutional: He appears well-developed and well-nourished. No distress.  HENT:  Mouth/Throat: Oropharynx is clear and moist. No oropharyngeal exudate.  Pulmonary/Chest: He has wheezes (musical expiratory wheezes in both lung fields).       Assessment:    1. Wheezing: ? Trigger PND or nocturnal reflux - predniSONE (DELTASONE) 20 MG tablet; One pill twice daily for 5 days  Dispense: 10 tablet; Refill: 0  2. Non-seasonal allergic rhinitis, unspecified trigger - predniSONE (DELTASONE) 20 MG tablet; One pill twice daily for 5 days  Dispense: 10 tablet; Refill: 0    Plan:    Phone f/u 12/13.

## 2018-06-12 ENCOUNTER — Other Ambulatory Visit: Payer: Self-pay | Admitting: Family Medicine

## 2018-06-12 MED ORDER — BENZONATATE 100 MG PO CAPS
ORAL_CAPSULE | ORAL | 0 refills | Status: DC
Start: 2018-06-12 — End: 2018-10-08

## 2018-07-07 ENCOUNTER — Other Ambulatory Visit: Payer: Self-pay

## 2018-07-07 ENCOUNTER — Ambulatory Visit: Payer: BLUE CROSS/BLUE SHIELD | Admitting: Family Medicine

## 2018-07-07 ENCOUNTER — Encounter: Payer: Self-pay | Admitting: Family Medicine

## 2018-07-07 VITALS — BP 152/100 | HR 92 | Temp 98.3°F | Ht 71.0 in | Wt 251.8 lb

## 2018-07-07 DIAGNOSIS — G8929 Other chronic pain: Secondary | ICD-10-CM | POA: Insufficient documentation

## 2018-07-07 DIAGNOSIS — M25562 Pain in left knee: Secondary | ICD-10-CM | POA: Diagnosis not present

## 2018-07-07 DIAGNOSIS — E119 Type 2 diabetes mellitus without complications: Secondary | ICD-10-CM

## 2018-07-07 DIAGNOSIS — G5602 Carpal tunnel syndrome, left upper limb: Secondary | ICD-10-CM | POA: Insufficient documentation

## 2018-07-07 LAB — POCT GLYCOSYLATED HEMOGLOBIN (HGB A1C): Hemoglobin A1C: 6.5 % — AB (ref 4.0–5.6)

## 2018-07-07 NOTE — Progress Notes (Signed)
  Subjective:     Patient ID: Dean Valentine, male   DOB: 11-07-1960, 58 y.o.   MRN: [id number] Chief Complaint  Patient presents with  . Follow-up    3 month diabetes   HPI Reports he has been on two courses of prednisone (one from me and one from his mother) which resolved his wheezing. Has mild residual PND. Also has chronic left knee pain, left carpal tunnel symptoms with muscle wasting, and chronic shoulder pain. He states he will call me when he wishes to initiate x-rays and refer to orthopedics.  Review of Systems  Respiratory: Negative for shortness of breath.   Cardiovascular: Negative for chest pain and palpitations.       Objective:   Physical Exam Constitutional:      General: He is not in acute distress.    Appearance: He is not ill-appearing.  Cardiovascular:     Rate and Rhythm: Normal rate and regular rhythm.  Pulmonary:     Breath sounds: Normal breath sounds.  Musculoskeletal:     Right lower leg: No edema.     Left lower leg: No edema.  Neurological:     Mental Status: He is alert.        Assessment:    1. Diabetes mellitus without complication (HCC) - POCT glycosylated hemoglobin (Hb A1C)  2. Left carpal tunnel syndrome  3. Chronic pain of left knee    Plan:    Further f/u in 3 months or sooner as needed.

## 2018-07-07 NOTE — Patient Instructions (Signed)
Call for referral to orthopedics when you are ready for your knee, wrist, or shoulders.

## 2018-08-05 ENCOUNTER — Other Ambulatory Visit: Payer: Self-pay | Admitting: Family Medicine

## 2018-08-05 DIAGNOSIS — E119 Type 2 diabetes mellitus without complications: Secondary | ICD-10-CM

## 2018-08-05 MED ORDER — METFORMIN HCL 1000 MG PO TABS: 1000.0000 mg | ORAL_TABLET | Freq: Two times a day (BID) | ORAL | 1 refills | Status: DC

## 2018-08-05 NOTE — Telephone Encounter (Signed)
Patient needs refill on Metformin 1000 mg. [facility] [address]

## 2018-10-06 ENCOUNTER — Ambulatory Visit: Payer: Self-pay | Admitting: Family Medicine

## 2018-10-08 ENCOUNTER — Other Ambulatory Visit: Payer: Self-pay

## 2018-10-08 ENCOUNTER — Ambulatory Visit: Payer: Self-pay | Admitting: Family Medicine

## 2018-10-08 ENCOUNTER — Encounter: Payer: Self-pay | Admitting: Physician Assistant

## 2018-10-08 ENCOUNTER — Ambulatory Visit (INDEPENDENT_AMBULATORY_CARE_PROVIDER_SITE_OTHER): Payer: BLUE CROSS/BLUE SHIELD | Admitting: Physician Assistant

## 2018-10-08 VITALS — BP 138/93 | HR 92 | Temp 98.3°F | Resp 16 | Wt 265.0 lb

## 2018-10-08 DIAGNOSIS — E119 Type 2 diabetes mellitus without complications: Secondary | ICD-10-CM

## 2018-10-08 DIAGNOSIS — I152 Hypertension secondary to endocrine disorders: Secondary | ICD-10-CM

## 2018-10-08 DIAGNOSIS — E1159 Type 2 diabetes mellitus with other circulatory complications: Secondary | ICD-10-CM

## 2018-10-08 DIAGNOSIS — E785 Hyperlipidemia, unspecified: Secondary | ICD-10-CM

## 2018-10-08 DIAGNOSIS — Z23 Encounter for immunization: Secondary | ICD-10-CM | POA: Diagnosis not present

## 2018-10-08 DIAGNOSIS — I10 Essential (primary) hypertension: Secondary | ICD-10-CM | POA: Diagnosis not present

## 2018-10-08 DIAGNOSIS — E1169 Type 2 diabetes mellitus with other specified complication: Secondary | ICD-10-CM | POA: Diagnosis not present

## 2018-10-08 DIAGNOSIS — Z6836 Body mass index (BMI) 36.0-36.9, adult: Secondary | ICD-10-CM

## 2018-10-08 DIAGNOSIS — Z114 Encounter for screening for human immunodeficiency virus [HIV]: Secondary | ICD-10-CM | POA: Diagnosis not present

## 2018-10-08 LAB — POCT GLYCOSYLATED HEMOGLOBIN (HGB A1C): Hemoglobin A1C: 6.2 % — AB (ref 4.0–5.6)

## 2018-10-08 MED ORDER — DULAGLUTIDE 0.75 MG/0.5ML ~~LOC~~ SOAJ: 0.7500 mg | SUBCUTANEOUS | 1 refills | Status: DC

## 2018-10-08 NOTE — Patient Instructions (Addendum)
Diabetes Mellitus and Exercise Exercising regularly is important for your overall health, especially when you have diabetes (diabetes mellitus). Exercising is not only about losing weight. It has many other health benefits, such as increasing muscle strength and bone density and reducing body fat and stress. This leads to improved fitness, flexibility, and endurance, all of which result in better overall health. Exercise has additional benefits for people with diabetes, including:  Reducing appetite.  Helping to lower and control blood glucose.  Lowering blood pressure.  Helping to control amounts of fatty substances (lipids) in the blood, such as cholesterol and triglycerides.  Helping the body to respond better to insulin (improving insulin sensitivity).  Reducing how much insulin the body needs.  Decreasing the risk for heart disease by: ? Lowering cholesterol and triglyceride levels. ? Increasing the levels of good cholesterol. ? Lowering blood glucose levels. What is my activity plan? Your health care provider or certified diabetes educator can help you make a plan for the type and frequency of exercise (activity plan) that works for you. Make sure that you:  Do at least 150 minutes of moderate-intensity or vigorous-intensity exercise each week. This could be brisk walking, biking, or water aerobics. ? Do stretching and strength exercises, such as yoga or weightlifting, at least 2 times a week. ? Spread out your activity over at least 3 days of the week.  Get some form of physical activity every day. ? Do not go more than 2 days in a row without some kind of physical activity. ? Avoid being inactive for more than 30 minutes at a time. Take frequent breaks to walk or stretch.  Choose a type of exercise or activity that you enjoy, and set realistic goals.  Start slowly, and gradually increase the intensity of your exercise over time. What do I need to know about managing my  diabetes?   Check your blood glucose before and after exercising. ? If your blood glucose is 240 mg/dL (13.3 mmol/L) or higher before you exercise, check your urine for ketones. If you have ketones in your urine, do not exercise until your blood glucose returns to normal. ? If your blood glucose is 100 mg/dL (5.6 mmol/L) or lower, eat a snack containing 15-20 grams of carbohydrate. Check your blood glucose 15 minutes after the snack to make sure that your level is above 100 mg/dL (5.6 mmol/L) before you start your exercise.  Know the symptoms of low blood glucose (hypoglycemia) and how to treat it. Your risk for hypoglycemia increases during and after exercise. Common symptoms of hypoglycemia can include: ? Hunger. ? Anxiety. ? Sweating and feeling clammy. ? Confusion. ? Dizziness or feeling light-headed. ? Increased heart rate or palpitations. ? Blurry vision. ? Tingling or numbness around the mouth, lips, or tongue. ? Tremors or shakes. ? Irritability.  Keep a rapid-acting carbohydrate snack available before, during, and after exercise to help prevent or treat hypoglycemia.  Avoid injecting insulin into areas of the body that are going to be exercised. For example, avoid injecting insulin into: ? The arms, when playing tennis. ? The legs, when jogging.  Keep records of your exercise habits. Doing this can help you and your health care provider adjust your diabetes management plan as needed. Write down: ? Food that you eat before and after you exercise. ? Blood glucose levels before and after you exercise. ? The type and amount of exercise you have done. ? When your insulin is expected to peak, if you use   insulin. Avoid exercising at times when your insulin is peaking.  When you start a new exercise or activity, work with your health care provider to make sure the activity is safe for you, and to adjust your insulin, medicines, or food intake as needed.  Drink plenty of water while  you exercise to prevent dehydration or heat stroke. Drink enough fluid to keep your urine clear or pale yellow. Summary  Exercising regularly is important for your overall health, especially when you have diabetes (diabetes mellitus).  Exercising has many health benefits, such as increasing muscle strength and bone density and reducing body fat and stress.  Your health care provider or certified diabetes educator can help you make a plan for the type and frequency of exercise (activity plan) that works for you.  When you start a new exercise or activity, work with your health care provider to make sure the activity is safe for you, and to adjust your insulin, medicines, or food intake as needed. This information is not intended to replace advice given to you by your health care provider. Make sure you discuss any questions you have with your health care provider. Document Released: 09/07/2003 Document Revised: 12/26/2016 Document Reviewed: 11/27/2015 Elsevier Interactive Patient Education  2019 Elsevier Inc.  

## 2018-10-08 NOTE — Progress Notes (Signed)
Patient: Dean JensenJohn L Valentine Male    DOB: [birth year]   58 y.o.   MRN: [id number] Visit Date: 10/08/2018  Today's Provider: Trey SailorsAdriana M Betta Balla, PA-C   Chief Complaint  Patient presents with  . Diabetes   Subjective:     HPI  Diabetes Mellitus Type II, Follow-up:   Lab Results  Component Value Date   HGBA1C 6.5 (A) 07/07/2018   HGBA1C 5.8 (A) 04/06/2018   HGBA1C 6.0 (A) 01/02/2018    Last seen for diabetes 3 months ago.  Management since then includes check a1c. He reports excellent compliance with treatment. He is not having side effects.  Current symptoms include none and have been stable. Home blood sugar records: fasting range: 110's  Episodes of hypoglycemia? no   Current Insulin Regimen:  Most Recent Eye Exam: Never done. Says he got Lasik eye surgery and his vision is fine.  Weight trend: increasing steadily.  Prior visit with dietician: No Current exercise: none Current diet habits: in general, an "unhealthy" diet. Former Pharmacist, communitybody builder. Eats red meat and fish only if it's fried. Says he cannot look at chicken anymore due to how much he ate when body building.   HLD: Hasn't had labs in nearly 2 years, says he has avoided doing them. Last LDL was 132. Says he won't take a statin unless he knows his cholesterol is high again.   Pertinent Labs:    Component Value Date/Time   CHOL 205 (H) 03/10/2017 0918   TRIG 141 03/10/2017 0918   HDL 48 03/10/2017 0918   LDLCALC 132 (H) 03/10/2017 0918   CREATININE 0.86 03/10/2017 0918    Wt Readings from Last 3 Encounters:  10/08/18 265 lb (120.2 kg)  07/07/18 251 lb 12.8 oz (114.2 kg)  06/08/18 252 lb (114.3 kg)   HTN: Says his blood pressures at home are 130's/90's and that his blood pressure is so high when he comes here due to road rage. He says he was on a BP remotely that his pulmonologist put him on and it didn't work so he stopped.  BP Readings from Last 3 Encounters:  10/08/18 (!) 138/93  07/07/18 (!) 152/100    06/08/18 (!) 170/110   Obesity: Says he has trouble controlling his appetite and is interested in appetite suppression.   ------------------------------------------------------------------------  Allergies  Allergen Reactions  . Aspirin     G.I. bleeding  . Ibuprofen     G.I. bleeding     Current Outpatient Medications:  .  glucose blood test strip, Check a fasting sugar in the morning and 2 hours after a meal 2 x week., Disp: 50 each, Rfl: 5 .  metFORMIN (GLUCOPHAGE) 1000 MG tablet, Take 1 tablet (1,000 mg total) by mouth 2 (two) times daily with a meal., Disp: 180 tablet, Rfl: 1 .  methocarbamol (ROBAXIN) 750 MG tablet, One or two 4 x day as needed for back spasms, Disp: 56 tablet, Rfl: 1  Current Facility-Administered Medications:  .  betamethasone acetate-betamethasone sodium phosphate (CELESTONE) injection 3 mg, 3 mg, Intramuscular, Once, Evans, Brent M, DPM .  betamethasone acetate-betamethasone sodium phosphate (CELESTONE) injection 3 mg, 3 mg, Intramuscular, Once, Felecia ShellingEvans, Brent M, DPM  Review of Systems  Constitutional: Negative.   HENT: Positive for congestion and rhinorrhea.   Eyes: Negative.   Respiratory: Negative.   Endocrine: Negative.   Allergic/Immunologic: Positive for environmental allergies.    Social History   Tobacco Use  . Smoking status: Never Smoker  .  Smokeless tobacco: Former Neurosurgeon    Types: Chew  . Tobacco comment: has quit smokeless tobacco in past 3x  Substance Use Topics  . Alcohol use: Yes    Alcohol/week: 0.0 standard drinks    Comment: occasional      Objective:   BP (!) 138/93 (BP Location: Right Arm, Patient Position: Sitting, Cuff Size: Large)   Pulse 92   Temp 98.3 F (36.8 C) (Oral)   Resp 16   Wt 265 lb (120.2 kg)   BMI 36.96 kg/m  Vitals:   10/08/18 1052  BP: (!) 138/93  Pulse: 92  Resp: 16  Temp: 98.3 F (36.8 C)  TempSrc: Oral  Weight: 265 lb (120.2 kg)     Physical Exam Constitutional:      Appearance:  Normal appearance. He is obese.  Cardiovascular:     Rate and Rhythm: Normal rate and regular rhythm.     Heart sounds: Normal heart sounds.  Pulmonary:     Effort: Pulmonary effort is normal.     Breath sounds: Normal breath sounds.  Skin:    General: Skin is warm and dry.  Neurological:     Mental Status: He is alert and oriented to person, place, and time. Mental status is at baseline.  Psychiatric:        Mood and Affect: Mood normal.        Behavior: Behavior normal.         Assessment & Plan    1. Diabetes mellitus without complication (HCC)  Foot exam and urine microalbumin completed today. Pneumovax completed today. Have explained that yearly maintenance of diabetes includes dilated diabetic eye exam and I have referred him for this today. He is currently taking metformin 1000 mg BID.  2. Hypertension associated with diabetes (HCC)  He very clearly has untreated hypertension as documented by at least two years of hypertensive BP readings. Patient reports that his blood pressure is elevated due to road rage. I have educated on risks of untreated HTN including MI, Stroke, heart failure. We will address this next time.  3. Hyperlipidemia associated with type 2 diabetes mellitus (HCC)  LDL elevated in 2018 above goal for person with diabetes mellitus. Patient says he wants to confirm his cholesterol is high so have gotten labs today and will send in Lipitor 10 mg QD. F/u in 3 mo for recheck.  4. Encounter for screening for HIV   5. Need for vaccination against Streptococcus pneumoniae   6. Morbid Obesity  Counseled on dietary interventions. Patient has left knee pain that limits ability to exercise. Will start trulicity for appetite suppression and glucose control, have done first dose in office.   The entirety of the information documented in the History of Present Illness, Review of Systems and Physical Exam were personally obtained by me. Portions of this information  were initially documented by Rondel Baton, CMA and reviewed by me for thoroughness and accuracy.       Trey Sailors, PA-C  [facility]

## 2018-10-09 LAB — LIPID PANEL
Chol/HDL Ratio: 3.4 ratio (ref 0.0–5.0)
Cholesterol, Total: 185 mg/dL (ref 100–199)
HDL: 54 mg/dL (ref >39)
LDL Calculated: 111 mg/dL — ABNORMAL HIGH (ref 0–99)
Triglycerides: 101 mg/dL (ref 0–149)
VLDL Cholesterol Cal: 20 mg/dL (ref 5–40)

## 2018-10-09 LAB — COMPREHENSIVE METABOLIC PANEL
ALT: 15 IU/L (ref 0–44)
AST: 14 IU/L (ref 0–40)
Albumin/Globulin Ratio: 2 (ref 1.2–2.2)
Albumin: 4.6 g/dL (ref 3.8–4.9)
Alkaline Phosphatase: 68 IU/L (ref 39–117)
BUN/Creatinine Ratio: 24 — ABNORMAL HIGH (ref 9–20)
BUN: 20 mg/dL (ref 6–24)
Bilirubin Total: 1.6 mg/dL — ABNORMAL HIGH (ref 0.0–1.2)
CO2: 20 mmol/L (ref 20–29)
Calcium: 9.8 mg/dL (ref 8.7–10.2)
Chloride: 102 mmol/L (ref 96–106)
Creatinine, Ser: 0.82 mg/dL (ref 0.76–1.27)
GFR calc Af Amer: 113 mL/min/{1.73_m2} (ref >59)
GFR calc non Af Amer: 98 mL/min/{1.73_m2} (ref >59)
Globulin, Total: 2.3 g/dL (ref 1.5–4.5)
Glucose: 92 mg/dL (ref 65–99)
Potassium: 4.5 mmol/L (ref 3.5–5.2)
Sodium: 142 mmol/L (ref 134–144)
Total Protein: 6.9 g/dL (ref 6.0–8.5)

## 2018-10-09 LAB — CBC WITH DIFFERENTIAL/PLATELET
Basophils Absolute: 0 10*3/uL (ref 0.0–0.2)
Basos: 1 %
EOS (ABSOLUTE): 0.3 10*3/uL (ref 0.0–0.4)
Eos: 5 %
Hematocrit: 46.4 % (ref 37.5–51.0)
Hemoglobin: 15.6 g/dL (ref 13.0–17.7)
Immature Grans (Abs): 0 10*3/uL (ref 0.0–0.1)
Immature Granulocytes: 0 %
Lymphocytes Absolute: 1.1 10*3/uL (ref 0.7–3.1)
Lymphs: 18 %
MCH: 28.8 pg (ref 26.6–33.0)
MCHC: 33.6 g/dL (ref 31.5–35.7)
MCV: 86 fL (ref 79–97)
Monocytes Absolute: 0.6 10*3/uL (ref 0.1–0.9)
Monocytes: 10 %
Neutrophils Absolute: 4.2 10*3/uL (ref 1.4–7.0)
Neutrophils: 66 %
Platelets: 270 10*3/uL (ref 150–450)
RBC: 5.42 x10E6/uL (ref 4.14–5.80)
RDW: 13 % (ref 11.6–15.4)
WBC: 6.2 10*3/uL (ref 3.4–10.8)

## 2018-10-09 LAB — MICROALBUMIN / CREATININE URINE RATIO
Creatinine, Urine: 159.7 mg/dL
Microalb/Creat Ratio: 6 mg/g creat (ref 0–29)
Microalbumin, Urine: 9.1 ug/mL

## 2018-10-09 LAB — TSH: TSH: 1.69 u[IU]/mL (ref 0.450–4.500)

## 2018-10-09 LAB — HIV ANTIBODY (ROUTINE TESTING W REFLEX): HIV Screen 4th Generation wRfx: NONREACTIVE

## 2018-10-12 ENCOUNTER — Telehealth: Payer: Self-pay

## 2018-10-12 NOTE — Telephone Encounter (Signed)
-----   Message from Trey Sailors, [place] sent at 10/12/2018  8:44 AM EDT ----- Labs normal except for cholesterol which is above goal for person with diabetes. Please send in Lipitor 10 mg QHS #90 no refills for HLD we have discussed this medication in office. Will get follow up labs at next OV.

## 2018-10-12 NOTE — Telephone Encounter (Signed)
Discussed with patient that his cholesterol IS too high, his LDL is 111 and his should be 75 for diabetes. Discussed he is at very high risk for having heart attack, stroke and this medication lowers risk of mortality from the above. Patient says he is not going to take medication because he does not want to damage his liver. Explained this is very unlikely. Patient then says he will fix his cholesterol with his diet. Explained that as standard of care with diabetes patient should be on statin. Patient then says he cannot afford it. Explained this medication is on 4 dollar list at [facility]. Patient says he doesn't "do" [facility]. Explained that his untreated HTN further increases his risk. Patient says men at work have similar blood pressures and are not doing anything about it. Explained this is not relevant to him. Patient reports his blood pressure has been borderline since high school and he has not done anything about it and it hasn't done anything so he is fine.Continues to reports his HTN is due to road rage. Explained HTN will cause damage over time and lead to heart failure, stroke, MI. Patient says he will bring BP log to next visit. Patient continues to refuse medications regarding this.

## 2018-10-12 NOTE — Telephone Encounter (Signed)
Patient notified of results. He states that he does not want the Lipitor 10 mg called in because of his cholesterol numbers not being high enough.

## 2018-12-11 ENCOUNTER — Telehealth: Payer: Self-pay | Admitting: Physician Assistant

## 2018-12-11 NOTE — Telephone Encounter (Signed)
OK noted.

## 2018-12-11 NOTE — Telephone Encounter (Signed)
Please call patient and let him know [facility] has called him and left messages on 5/21, 5/27, 5/29/ and 6/8. He needs to call them and schedule an eye exam for his diabetes.

## 2018-12-11 NOTE — Telephone Encounter (Signed)
Patient stated that he can not afford to go there yet and he have mention it to [place]. He said he will give them a call when he get the funds to go. FYI

## 2019-01-07 ENCOUNTER — Ambulatory Visit: Payer: Self-pay | Admitting: Physician Assistant

## 2019-01-21 ENCOUNTER — Ambulatory Visit: Payer: Self-pay | Admitting: Physician Assistant

## 2019-02-02 ENCOUNTER — Other Ambulatory Visit: Payer: Self-pay | Admitting: Physician Assistant

## 2019-02-02 DIAGNOSIS — E119 Type 2 diabetes mellitus without complications: Secondary | ICD-10-CM

## 2019-02-02 MED ORDER — METFORMIN HCL 1000 MG PO TABS: 1000.0000 mg | ORAL_TABLET | Freq: Two times a day (BID) | ORAL | 1 refills | Status: DC

## 2019-02-02 NOTE — Telephone Encounter (Signed)
[facility] faxed refill request for the following medications:   metFORMIN (GLUCOPHAGE) 1000 MG tablet     Please advise.  

## 2019-02-23 NOTE — Progress Notes (Signed)
Patient: Dean Valentine Male    DOB: May 24, 1961   58 y.o.   MRN: [id number] Visit Date: 02/24/2019  Today's Provider: Trinna Post, PA-C   Chief Complaint  Patient presents with  . Follow-up  . Diabetes  . Hyperlipidemia  . Hypertension   Subjective:     HPI    Diabetes Mellitus Type II, Follow-up:   Lab Results  Component Value Date   HGBA1C 6.2 (A) 10/08/2018   HGBA1C 6.5 (A) 07/07/2018   HGBA1C 5.8 (A) 04/06/2018   Last seen for diabetes 4 months ago.  Management since then includes; labs checked, no changes. He reports good compliance with treatment. He  having side effects. none Current symptoms include none and have been unchanged. Home blood sugar records: fasting range: 110-115  Episodes of hypoglycemia? no   Current Insulin Regimen: n Most Recent Eye Exam: due Weight trend: stable Prior visit with dietician: no Current diet: in general, an "unhealthy" diet Current exercise: none  Patient has not gotten eye exam, says he cannot afford it.  -----------------------------------------------------------   Hypertension, follow-up:  BP Readings from Last 3 Encounters:  02/24/19 131/89  10/08/18 (!) 138/93  07/07/18 (!) 152/100    He was last seen for hypertension 4 months ago.  BP at that visit was 138/93. Management since that visit includes; no changes.He reports good compliance with treatment. He is not having side effects. none He is not exercising. He is not adherent to low salt diet.   Outside blood pressures are 110/82. He is experiencing none.  Patient denies none.   Cardiovascular risk factors include diabetes mellitus.  Use of agents associated with hypertension: none.   -----------------------------------------------------------    Lipid/Cholesterol, Follow-up:   Last seen for this 4 months ago.  Management since that visit includes; labs checked. Recommended starting lipitor because he has diabetes and cholesterol is  elevated. Patient refused.   Last Lipid Panel:    Component Value Date/Time   CHOL 185 10/08/2018 1155   TRIG 101 10/08/2018 1155   HDL 54 10/08/2018 1155   CHOLHDL 3.4 10/08/2018 1155   CHOLHDL 4.3 03/10/2017 0918   LDLCALC 111 (H) 10/08/2018 1155   LDLCALC 132 (H) 03/10/2017 6789    He reports good compliance with treatment. He is not having side effects. none  Wt Readings from Last 3 Encounters:  02/24/19 276 lb (125.2 kg)  10/08/18 265 lb (120.2 kg)  07/07/18 251 lb 12.8 oz (114.2 kg)   Colon cancer screening: colonoscopy 6-7 years ago with Dr. Vira Agar at [facility]. Records not available in Care Everywhere.   Says he hit his head on a wire rack yesterday and caused a wound. Last tetanus 2012.  -----------------------------------------------------------    Allergies  Allergen Reactions  . Aspirin     G.I. bleeding  . Ibuprofen     G.I. bleeding     Current Outpatient Medications:  .  glucose blood test strip, Check a fasting sugar in the morning and 2 hours after a meal 2 x week., Disp: 50 each, Rfl: 5 .  Levocetirizine Dihydrochloride (XYZAL PO), Take by mouth., Disp: , Rfl:  .  metFORMIN (GLUCOPHAGE) 1000 MG tablet, Take 1 tablet (1,000 mg total) by mouth 2 (two) times daily with a meal., Disp: 180 tablet, Rfl: 1 .  methocarbamol (ROBAXIN) 750 MG tablet, One or two 4 x day as needed for back spasms, Disp: 56 tablet, Rfl: 1 .  Triamcinolone Acetonide (NASACORT AQ NA),  Place into the nose., Disp: , Rfl:  .  Dulaglutide (TRULICITY) 0.75 MG/0.5ML SOPN, Inject 0.75 mg into the skin once a week. (Patient not taking: Reported on 02/24/2019), Disp: 4 pen, Rfl: 1  Current Facility-Administered Medications:  .  betamethasone acetate-betamethasone sodium phosphate (CELESTONE) injection 3 mg, 3 mg, Intramuscular, Once, Evans, Brent M, DPM .  betamethasone acetate-betamethasone sodium phosphate (CELESTONE) injection 3 mg, 3 mg, Intramuscular, Once, Felecia ShellingEvans, Brent M, DPM   Review of Systems  Constitutional: Negative for appetite change, chills and fever.  Respiratory: Negative for chest tightness, shortness of breath and wheezing.   Cardiovascular: Negative for chest pain and palpitations.  Gastrointestinal: Negative for abdominal pain, nausea and vomiting.    Social History   Tobacco Use  . Smoking status: Never Smoker  . Smokeless tobacco: Former NeurosurgeonUser    Types: Chew  . Tobacco comment: has quit smokeless tobacco in past 3x  Substance Use Topics  . Alcohol use: Yes    Alcohol/week: 0.0 standard drinks    Comment: occasional      Objective:   BP 131/89 (BP Location: Right Arm, Patient Position: Sitting, Cuff Size: Large)   Pulse 98   Temp (!) 96.9 F (36.1 C) (Other (Comment))   Resp 16   Ht 5\' 11"  (1.803 m)   Wt 276 lb (125.2 kg)   SpO2 96%   BMI 38.49 kg/m  Vitals:   02/24/19 0840  BP: 131/89  Pulse: 98  Resp: 16  Temp: (!) 96.9 F (36.1 C)  TempSrc: Other (Comment)  SpO2: 96%  Weight: 276 lb (125.2 kg)  Height: 5\' 11"  (1.803 m)     Physical Exam Constitutional:      Appearance: Normal appearance. He is obese.  HENT:     Head:   Cardiovascular:     Rate and Rhythm: Normal rate and regular rhythm.     Heart sounds: Normal heart sounds.  Pulmonary:     Breath sounds: Normal breath sounds.  Skin:    General: Skin is warm and dry.  Neurological:     Mental Status: He is alert and oriented to person, place, and time. Mental status is at baseline.  Psychiatric:        Mood and Affect: Mood normal.        Behavior: Behavior normal.      No results found for any visits on 02/24/19.     Assessment & Plan    1. Diabetes mellitus without complication (HCC)  Last a1c controlled. He says he cannot afford an eye exam. I have asked him how much it is out of pocket because this is typically a covered preventive service. He says he doesn't remember the last time he got one but that it was 150$. I have asked him to consider  calling his insurance company and also the eye center for a quote on how much this will cost and he will not. I have counseled on the importance of having an eye exam.   ROI signed and will request records today.   - HgB A1c  2. Statin declined  Categorically refuses statin. I have counseled this is standard of care for diabetes and he says his cholesterol is "borderline" and he will not take it. I have counseled his cholesterol is NOT borderline in the context of diabetes and he continues to refuse. Counseled on risks of refusing this medication including heart attack and stroke.  3. Hypertension associated with diabetes (HCC)  Controlled today.  4. Class 2 severe obesity due to excess calories with serious comorbidity and body mass index (BMI) of 36.0 to 36.9 in adult [facility])  Failed trulicity, said it made his stomach hurt. Needs to adhere to diabetic diet, is approaching morbid obesity. Do not think weight loss aids will be of value to him.  5. Hyperlipidemia associated with type 2 diabetes mellitus (HCC)   6. Open wound of scalp, unspecified open wound type, sequela   7. Need for tetanus, diphtheria, and acellular pertussis (Tdap) vaccine  Follow up in 4 months for diabetes.      Trey Sailors, PA-C  [facility]

## 2019-02-24 ENCOUNTER — Encounter: Payer: Self-pay | Admitting: Physician Assistant

## 2019-02-24 ENCOUNTER — Ambulatory Visit (INDEPENDENT_AMBULATORY_CARE_PROVIDER_SITE_OTHER): Payer: BC Managed Care – PPO | Admitting: Physician Assistant

## 2019-02-24 ENCOUNTER — Other Ambulatory Visit: Payer: Self-pay

## 2019-02-24 VITALS — BP 131/89 | HR 98 | Temp 96.9°F | Resp 16 | Ht 71.0 in | Wt 276.0 lb

## 2019-02-24 DIAGNOSIS — S0100XS Unspecified open wound of scalp, sequela: Secondary | ICD-10-CM

## 2019-02-24 DIAGNOSIS — Z5320 Procedure and treatment not carried out because of patient's decision for unspecified reasons: Secondary | ICD-10-CM | POA: Diagnosis not present

## 2019-02-24 DIAGNOSIS — E119 Type 2 diabetes mellitus without complications: Secondary | ICD-10-CM | POA: Diagnosis not present

## 2019-02-24 DIAGNOSIS — Z23 Encounter for immunization: Secondary | ICD-10-CM

## 2019-02-24 DIAGNOSIS — E1169 Type 2 diabetes mellitus with other specified complication: Secondary | ICD-10-CM

## 2019-02-24 DIAGNOSIS — E1159 Type 2 diabetes mellitus with other circulatory complications: Secondary | ICD-10-CM | POA: Diagnosis not present

## 2019-02-24 DIAGNOSIS — I10 Essential (primary) hypertension: Secondary | ICD-10-CM

## 2019-02-24 DIAGNOSIS — Z6836 Body mass index (BMI) 36.0-36.9, adult: Secondary | ICD-10-CM

## 2019-02-24 DIAGNOSIS — I152 Hypertension secondary to endocrine disorders: Secondary | ICD-10-CM

## 2019-02-24 DIAGNOSIS — E785 Hyperlipidemia, unspecified: Secondary | ICD-10-CM

## 2019-02-24 NOTE — Addendum Note (Signed)
Addended by: Julieta Bellini on: 02/24/2019 09:34 AM   Modules accepted: Orders

## 2019-02-25 ENCOUNTER — Telehealth: Payer: Self-pay | Admitting: *Deleted

## 2019-02-25 LAB — HEMOGLOBIN A1C
Est. average glucose Bld gHb Est-mCnc: 151 mg/dL
Hgb A1c MFr Bld: 6.9 % — ABNORMAL HIGH (ref 4.8–5.6)

## 2019-02-25 NOTE — Telephone Encounter (Signed)
Patient was notified of results. Expressed understanding.  

## 2019-02-25 NOTE — Telephone Encounter (Signed)
LMOVM for pt to return call 

## 2019-02-25 NOTE — Telephone Encounter (Signed)
-----   Message from Trinna Post, [place] sent at 02/25/2019  8:21 AM EDT ----- A1c still controlled but steadily increasing. He did gain about 9 lbs from last visit. Needs to focus on losing weight. He may change his follow up to a different provider if he wishes.

## 2019-07-06 ENCOUNTER — Ambulatory Visit: Payer: Self-pay | Admitting: Physician Assistant

## 2019-08-03 ENCOUNTER — Encounter: Payer: Self-pay | Admitting: Family Medicine

## 2019-08-03 ENCOUNTER — Ambulatory Visit: Payer: BC Managed Care – PPO | Admitting: Family Medicine

## 2019-08-03 ENCOUNTER — Other Ambulatory Visit: Payer: Self-pay

## 2019-08-03 VITALS — BP 143/91 | HR 93 | Temp 96.8°F | Resp 16 | Ht 70.5 in | Wt 290.0 lb

## 2019-08-03 DIAGNOSIS — M5126 Other intervertebral disc displacement, lumbar region: Secondary | ICD-10-CM

## 2019-08-03 DIAGNOSIS — I152 Hypertension secondary to endocrine disorders: Secondary | ICD-10-CM

## 2019-08-03 DIAGNOSIS — E119 Type 2 diabetes mellitus without complications: Secondary | ICD-10-CM

## 2019-08-03 DIAGNOSIS — E1169 Type 2 diabetes mellitus with other specified complication: Secondary | ICD-10-CM | POA: Diagnosis not present

## 2019-08-03 DIAGNOSIS — E785 Hyperlipidemia, unspecified: Secondary | ICD-10-CM

## 2019-08-03 DIAGNOSIS — E1159 Type 2 diabetes mellitus with other circulatory complications: Secondary | ICD-10-CM

## 2019-08-03 DIAGNOSIS — I10 Essential (primary) hypertension: Secondary | ICD-10-CM

## 2019-08-03 LAB — POCT GLYCOSYLATED HEMOGLOBIN (HGB A1C)
Est. average glucose Bld gHb Est-mCnc: 157
Hemoglobin A1C: 7.1 % — AB (ref 4.0–5.6)

## 2019-08-03 MED ORDER — METFORMIN HCL 1000 MG PO TABS: 1000.0000 mg | ORAL_TABLET | Freq: Two times a day (BID) | ORAL | 1 refills | Status: DC

## 2019-08-03 MED ORDER — METHOCARBAMOL 750 MG PO TABS: ORAL_TABLET | ORAL | 1 refills | Status: DC

## 2019-08-03 NOTE — Progress Notes (Signed)
Patient: Dean Valentine Male    DOB: August 06, 1960   59 y.o.   MRN: [id number] Visit Date: 08/03/2019  Today's Provider: Lavon Paganini, MD   Chief Complaint  Patient presents with  . Diabetes  . Hypertension  . Hyperlipidemia   Subjective:     HPI  Diabetes Mellitus Type II, Follow-up:   Lab Results  Component Value Date   HGBA1C 7.1 (A) 08/03/2019   HGBA1C 6.9 (H) 02/24/2019   HGBA1C 6.2 (A) 10/08/2018    Last seen for diabetes 5 months ago.  Management since then includes no changes. Patient advised to follow diabetic diet. Failed Trulicity. He reports excellent compliance with treatment. He is not having side effects.  Current symptoms include none and have been stable. Home blood sugar records: not being checked  Did have a course of prednisone in 06/2019 which caused hyperglycemia  Episodes of hypoglycemia? no   Current insulin regiment: Is not on insulin Most Recent Eye Exam: Due Weight trend: stable Prior visit with dietician: No Current exercise: no regular exercise and walking Current diet habits: in general, an "unhealthy" diet  Pertinent Labs:    Component Value Date/Time   CHOL 185 10/08/2018 1155   TRIG 101 10/08/2018 1155   HDL 54 10/08/2018 1155   LDLCALC 111 (H) 10/08/2018 1155   LDLCALC 132 (H) 03/10/2017 0918   CREATININE 0.82 10/08/2018 1155   CREATININE 0.86 03/10/2017 0918    Wt Readings from Last 3 Encounters:  08/03/19 290 lb (131.5 kg)  02/24/19 276 lb (125.2 kg)  10/08/18 265 lb (120.2 kg)    ------------------------------------------------------------------------  Hypertension, follow-up:  BP Readings from Last 3 Encounters:  08/03/19 (!) 143/91  02/24/19 131/89  10/08/18 (!) 138/93    He was last seen for hypertension 5 months ago.  BP at that visit was 131/89. Management changes since that visit include no changes. He reports excellent compliance with treatment. He is not having side effects.  He is not  exercising. He is not adherent to low salt diet.   Outside blood pressures are stable. He is experiencing none.  Patient denies chest pain and lower extremity edema.   Cardiovascular risk factors include advanced age (older than 26 for men, 40 for women), diabetes mellitus, hypertension and obesity (BMI >= 30 kg/m2).  Use of agents associated with hypertension: decongestants and steroids.     Weight trend: increasing steadily Wt Readings from Last 3 Encounters:  08/03/19 290 lb (131.5 kg)  02/24/19 276 lb (125.2 kg)  10/08/18 265 lb (120.2 kg)    Current diet: in general, an "unhealthy" diet  ------------------------------------------------------------------------   Lipid/Cholesterol, Follow-up:   Last seen for this5 months ago.  Management changes since that visit include no changes. Patient declined statin. Last Lipid Panel:    Component Value Date/Time   CHOL 185 10/08/2018 1155   TRIG 101 10/08/2018 1155   HDL 54 10/08/2018 1155   CHOLHDL 3.4 10/08/2018 1155   CHOLHDL 4.3 03/10/2017 0918   LDLCALC 111 (H) 10/08/2018 1155   LDLCALC 132 (H) 03/10/2017 0918    Risk factors for vascular disease include diabetes mellitus, hypercholesterolemia and hypertension  Current symptoms include none and have been stable. Weight trend: increasing steadily Prior visit with dietician: no Current diet: in general, an "unhealthy" diet Current exercise: no regular exercise and walking  Wt Readings from Last 3 Encounters:  08/03/19 290 lb (131.5 kg)  02/24/19 276 lb (125.2 kg)  10/08/18 265 lb (120.2  kg)    -------------------------------------------------------------------  Allergies  Allergen Reactions  . Aspirin     G.I. bleeding  . Ibuprofen     G.I. bleeding     Current Outpatient Medications:  .  glucose blood test strip, Check a fasting sugar in the morning and 2 hours after a meal 2 x week., Disp: 50 each, Rfl: 5 .  Levocetirizine Dihydrochloride (XYZAL PO),  Take by mouth., Disp: , Rfl:  .  metFORMIN (GLUCOPHAGE) 1000 MG tablet, Take 1 tablet (1,000 mg total) by mouth 2 (two) times daily with a meal., Disp: 180 tablet, Rfl: 1 .  methocarbamol (ROBAXIN) 750 MG tablet, One or two 4 x day as needed for back spasms, Disp: 56 tablet, Rfl: 1 .  Triamcinolone Acetonide (NASACORT AQ NA), Place into the nose., Disp: , Rfl:   Current Facility-Administered Medications:  .  betamethasone acetate-betamethasone sodium phosphate (CELESTONE) injection 3 mg, 3 mg, Intramuscular, Once, Evans, Brent M, DPM .  betamethasone acetate-betamethasone sodium phosphate (CELESTONE) injection 3 mg, 3 mg, Intramuscular, Once, Edrick Kins, DPM  Review of Systems  Constitutional: Negative for appetite change and fatigue.  Eyes: Negative for photophobia, pain, discharge and visual disturbance.  Respiratory: Negative for cough, chest tightness, shortness of breath and wheezing.   Cardiovascular: Negative for chest pain, palpitations and leg swelling.  Psychiatric/Behavioral: The patient is not nervous/anxious.     Social History   Tobacco Use  . Smoking status: Never Smoker  . Smokeless tobacco: Former Systems developer    Types: Chew  . Tobacco comment: has quit smokeless tobacco in past 3x  Substance Use Topics  . Alcohol use: Yes    Alcohol/week: 0.0 standard drinks    Comment: occasional      Objective:   BP (!) 143/91   Pulse 93   Temp (!) 96.8 F (36 C) (Temporal)   Resp 16   Ht 5' 10.5" (1.791 m)   Wt 290 lb (131.5 kg)   BMI 41.02 kg/m  Vitals:   08/03/19 0831 08/03/19 0832  BP: (!) 146/92 (!) 143/91  Pulse: 93   Resp: 16   Temp: (!) 96.8 F (36 C)   TempSrc: Temporal   Weight: 290 lb (131.5 kg)   Height: 5' 10.5" (1.791 m)   Body mass index is 41.02 kg/m.   Physical Exam Vitals reviewed.  Constitutional:      General: He is not in acute distress.    Appearance: He is obese. He is not diaphoretic.  HENT:     Head: Normocephalic and atraumatic.    Eyes:     General: No scleral icterus.    Conjunctiva/sclera: Conjunctivae normal.  Cardiovascular:     Rate and Rhythm: Normal rate and regular rhythm.     Pulses: Normal pulses.     Heart sounds: Normal heart sounds. No murmur.  Pulmonary:     Effort: Pulmonary effort is normal. No respiratory distress.     Breath sounds: Normal breath sounds. No wheezing or rhonchi.  Abdominal:     General: There is no distension.     Palpations: Abdomen is soft.     Tenderness: There is no abdominal tenderness.  Musculoskeletal:     Cervical back: Neck supple.     Right lower leg: No edema.     Left lower leg: No edema.  Lymphadenopathy:     Cervical: No cervical adenopathy.  Skin:    General: Skin is warm and dry.     Capillary Refill: Capillary refill  takes less than 2 seconds.     Findings: No rash.  Neurological:     Mental Status: He is alert and oriented to person, place, and time. Mental status is at baseline.     Cranial Nerves: No cranial nerve deficit.  Psychiatric:        Mood and Affect: Mood normal.        Behavior: Behavior normal.      Results for orders placed or performed in visit on 08/03/19  POCT glycosylated hemoglobin (Hb A1C)  Result Value Ref Range   Hemoglobin A1C 7.1 (A) 4.0 - 5.6 %   Est. average glucose Bld gHb Est-mCnc 157        Assessment & Plan    Problem List Items Addressed This Visit      Cardiovascular and Mediastinum   Hypertension associated with diabetes ([place])    Uncontrolled today Patient Is not currently on a medication for this He believes that his diet has been poor He would like to try 3 months of lifestyle interventions prior to starting a medication We discussed DASH diet and importance of exercise He also believes that his anxiety around seeing a new provider today contributed to this We will follow-up in 3 months, and if still uncontrolled, would start antihypertensive      Relevant Medications   metFORMIN (GLUCOPHAGE) 1000  MG tablet     Endocrine   Diabetes mellitus without complication ([place]) - Primary    Chronic and uncontrolled A1c continues to elevate at each visit A1c today is 7.1 We discussed goal A1c less than 7 Continue metformin at current dose Patient would like 3 months of lifestyle interventions prior to adding another medication We discussed importance of low-carb diet and regular exercise He is up-to-date on foot exam We discussed importance of eye exam, but patient does not feel like he can afford this at this time, but may reconsider at the end of the year when he has met his deductible He is not currently on a statin, but we did discuss the importance of this As below, we will plan to recheck his lipid panel at next follow-up and if still uncontrolled, will reassess again Follow-up in 3 months      Relevant Medications   metFORMIN (GLUCOPHAGE) 1000 MG tablet   Hyperlipidemia associated with type 2 diabetes mellitus ([place])    Discussed with patient that his last LDL was not to goal of less than 70 in the setting of diabetes We discussed the rationale between using a statin diabetes, as it is such a large heart disease/stroke risk factor Patient is very hesitant to take a statin at this time We discussed that we will continue to discuss again Plan to recheck fasting lipid panel at follow-up visit in 3 months      Relevant Medications   metFORMIN (GLUCOPHAGE) 1000 MG tablet     Musculoskeletal and Integument   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   The entirety of the information documented in the History of Present Illness, Review of Systems and Physical Exam  were personally obtained by me. Portions of  this information were initially documented by Lynford Humphrey, CMA and reviewed by me for thoroughness and accuracy.    Arkin Imran, Dionne Bucy, MD MPH [facility]

## 2019-08-03 NOTE — Patient Instructions (Signed)
  Diet Recommendations for Diabetes   Starchy (carb) foods include: Bread, rice, pasta, potatoes, corn, crackers, bagels, muffins, all baked goods.  (Fruits, milk, and yogurt also have carbohydrate, but most of these foods will not spike your blood sugar as the starchy foods will.)  A few fruits do cause high blood sugars; use small portions of bananas (limit to 1/2 at a time), grapes, watermelon, and most tropical fruits.    Protein foods include: Meat, fish, poultry, eggs, dairy foods, and beans such as pinto and kidney beans (beans also provide carbohydrate).   1. Limit starchy foods to TWO per meal and ONE per snack. ONE portion of a starchy  food is equal to the following:   - ONE slice of bread (or its equivalent, such as half of a hamburger bun).   - 1/2 cup of a "scoopable" starchy food such as potatoes or rice.   - 15 grams of carbohydrate as shown on food label.  2. Include at every meal: a protein food, a carb food, and vegetables and/or fruit.   - Obtain twice as many veg's as protein or carbohydrate foods for both lunch and dinner.   - Fresh or frozen veg's are best.   - Try to keep frozen veg's on hand for a quick vegetable serving.        

## 2019-08-04 NOTE — Assessment & Plan Note (Signed)
Uncontrolled today Patient Is not currently on a medication for this He believes that his diet has been poor He would like to try 3 months of lifestyle interventions prior to starting a medication We discussed DASH diet and importance of exercise He also believes that his anxiety around seeing a new provider today contributed to this We will follow-up in 3 months, and if still uncontrolled, would start antihypertensive

## 2019-08-04 NOTE — Assessment & Plan Note (Signed)
Discussed importance of healthy weight management Discussed diet and exercise  

## 2019-08-04 NOTE — Assessment & Plan Note (Signed)
Chronic and uncontrolled A1c continues to elevate at each visit A1c today is 7.1 We discussed goal A1c less than 7 Continue metformin at current dose Patient would like 3 months of lifestyle interventions prior to adding another medication We discussed importance of low-carb diet and regular exercise He is up-to-date on foot exam We discussed importance of eye exam, but patient does not feel like he can afford this at this time, but may reconsider at the end of the year when he has met his deductible He is not currently on a statin, but we did discuss the importance of this As below, we will plan to recheck his lipid panel at next follow-up and if still uncontrolled, will reassess again Follow-up in 3 months

## 2019-08-04 NOTE — Assessment & Plan Note (Signed)
Discussed with patient that his last LDL was not to goal of less than 70 in the setting of diabetes We discussed the rationale between using a statin diabetes, as it is such a large heart disease/stroke risk factor Patient is very hesitant to take a statin at this time We discussed that we will continue to discuss again Plan to recheck fasting lipid panel at follow-up visit in 3 months

## 2019-11-02 NOTE — Progress Notes (Addendum)
I,Laura E Walsh,acting as a scribe for Shirlee Latch, MD.,have documented all relevant documentation on the behalf of Shirlee Latch, MD,as directed by  Shirlee Latch, MD while in the presence of Shirlee Latch, MD.   Complete physical exam   Patient: Dean Valentine   DOB: 1961-05-31   60 y.o. Male  MRN: [id number] Visit Date: 11/03/2019  Today's healthcare provider: Shirlee Latch, MD   Chief Complaint  Patient presents with  . Annual Exam   Subjective    Dean Valentine is a 59 y.o. male who presents today for a complete physical exam.  He reports consuming a general diet. The patient does not participate in regular exercise at present. He generally feels well. He reports sleeping fairly well. He does have additional problems to discuss today.  HPI    Past Medical History:  Diagnosis Date  . Acute posthemorrhagic anemia   . Allergic rhinitis   . Diabetes mellitus without complication (HCC)   . Diverticulosis of colon without hemorrhage   . Elevated blood pressure   . Headache   . Hyperglycemia   . Internal hemorrhoid    Past Surgical History:  Procedure Laterality Date  . COLONOSCOPY  2008   diverticulosis and internal hemorrhoids  . ESOPHAGOGASTRODUODENOSCOPY  1998. 2009   dudonal ulcer  (GI bleed hospitalized)  . LUMBAR EPIDURAL INJECTION  2012   Dr. Marikay Alar   Social History   Socioeconomic History  . Marital status: Single    Spouse name: Not on file  . Number of children: Not on file  . Years of education: Not on file  . Highest education level: Not on file  Occupational History  . Not on file  Tobacco Use  . Smoking status: Never Smoker  . Smokeless tobacco: Current User    Types: Chew  . Tobacco comment: has quit smokeless tobacco in past 3x  Substance and Sexual Activity  . Alcohol use: Yes    Alcohol/week: 0.0 standard drinks    Comment: occasional  . Drug use: No  . Sexual activity: Not on file  Other Topics Concern  .  Not on file  Social History Narrative  . Not on file   Social Determinants of Health   Financial Resource Strain:   . Difficulty of Paying Living Expenses:   Food Insecurity:   . Worried About Programme researcher, broadcasting/film/video in the Last Year:   . Barista in the Last Year:   Transportation Needs:   . Freight forwarder (Medical):   Marland Kitchen Lack of Transportation (Non-Medical):   Physical Activity:   . Days of Exercise per Week:   . Minutes of Exercise per Session:   Stress:   . Feeling of Stress :   Social Connections:   . Frequency of Communication with Friends and Family:   . Frequency of Social Gatherings with Friends and Family:   . Attends Religious Services:   . Active Member of Clubs or Organizations:   . Attends Banker Meetings:   Marland Kitchen Marital Status:   Intimate Partner Violence:   . Fear of Current or Ex-Partner:   . Emotionally Abused:   Marland Kitchen Physically Abused:   . Sexually Abused:    Family Status  Relation Name Status  . Mother  Alive  . Father  Alive  . Sister  Alive  . PGM  Deceased at age 63  . PGF  Deceased at age 17  . Sister  Alive  .  MGM  (Not Specified)  . MGF  (Not Specified)   Family History  Problem Relation Age of Onset  . Asthma Mother   . Colon polyps Mother   . Diabetes Father   . Hypertension Father   . Hypothyroidism Father   . Asthma Sister   . Ulcers Sister   . Ulcers Sister   . Alzheimer's disease Maternal Grandmother   . Emphysema Maternal Grandfather    Allergies  Allergen Reactions  . Aspirin     G.I. bleeding  . Ibuprofen     G.I. bleeding    Patient Care Team: Erasmo Downer, MD as PCP - General (Family Medicine)   Medications: Outpatient Medications Prior to Visit  Medication Sig  . glucose blood test strip Check a fasting sugar in the morning and 2 hours after a meal 2 x week.  . Levocetirizine Dihydrochloride (XYZAL PO) Take by mouth.  . metFORMIN (GLUCOPHAGE) 1000 MG tablet Take 1 tablet (1,000 mg  total) by mouth 2 (two) times daily with a meal.  . methocarbamol (ROBAXIN) 750 MG tablet One or two 4 x day as needed for back spasms  . Triamcinolone Acetonide (NASACORT AQ NA) Place into the nose.   Facility-Administered Medications Prior to Visit  Medication Dose Route Frequency Provider  . betamethasone acetate-betamethasone sodium phosphate (CELESTONE) injection 3 mg  3 mg Intramuscular Once Gala Lewandowsky M, DPM  . betamethasone acetate-betamethasone sodium phosphate (CELESTONE) injection 3 mg  3 mg Intramuscular Once Felecia Shelling, DPM    Review of Systems  Constitutional: Negative.   HENT: Positive for rhinorrhea, sinus pressure and sneezing. Negative for congestion, dental problem, drooling, ear discharge, ear pain, facial swelling, hearing loss, mouth sores, nosebleeds, postnasal drip, sinus pain, sore throat, tinnitus, trouble swallowing and voice change.   Eyes: Negative.   Respiratory: Positive for cough and wheezing. Negative for apnea, choking, chest tightness, shortness of breath and stridor.   Cardiovascular: Negative.   Gastrointestinal: Negative.   Endocrine: Negative.   Genitourinary: Negative.   Musculoskeletal: Positive for arthralgias and back pain. Negative for gait problem, joint swelling, myalgias, neck pain and neck stiffness.  Skin: Negative.   Allergic/Immunologic: Positive for environmental allergies. Negative for food allergies and immunocompromised state.  Neurological: Negative.   Hematological: Negative.   Psychiatric/Behavioral: Negative.     Last CBC Lab Results  Component Value Date   WBC 6.2 10/08/2018   HGB 15.6 10/08/2018   HCT 46.4 10/08/2018   MCV 86 10/08/2018   MCH 28.8 10/08/2018   RDW 13.0 10/08/2018   PLT 270 10/08/2018   Last metabolic panel Lab Results  Component Value Date   GLUCOSE 136 (H) 11/03/2019   NA 139 11/03/2019   K 4.5 11/03/2019   CL 104 11/03/2019   CO2 19 (L) 11/03/2019   BUN 16 11/03/2019   CREATININE 0.88  11/03/2019   GFRNONAA 95 11/03/2019   GFRAA 109 11/03/2019   CALCIUM 9.1 11/03/2019   PROT 6.4 11/03/2019   ALBUMIN 4.3 11/03/2019   LABGLOB 2.1 11/03/2019   AGRATIO 2.0 11/03/2019   BILITOT 1.8 (H) 11/03/2019   ALKPHOS 81 11/03/2019   AST 11 11/03/2019   ALT 11 11/03/2019   Last lipids Lab Results  Component Value Date   CHOL 151 11/03/2019   HDL 48 11/03/2019   LDLCALC 90 11/03/2019   TRIG 65 11/03/2019   CHOLHDL 3.4 10/08/2018   Last hemoglobin A1c Lab Results  Component Value Date   HGBA1C 7.1 (A) 11/03/2019  Last thyroid functions Lab Results  Component Value Date   TSH 1.690 10/08/2018   Last vitamin D No results found for: 25OHVITD2, 25OHVITD3, VD25OH Last vitamin B12 and Folate No results found for: VITAMINB12, FOLATE  Objective    BP 139/89 (BP Location: Left Arm, Patient Position: Sitting, Cuff Size: Large)   Pulse 90   Temp (!) 96 F (35.6 C) (Temporal)   Ht 5\' 10"  (1.778 m)   Wt 271 lb (122.9 kg)   BMI 38.88 kg/m  BP Readings from Last 3 Encounters:  11/03/19 139/89  08/03/19 (!) 143/91  02/24/19 131/89    Physical Exam Vitals and nursing note reviewed.  Constitutional:      General: He is not in acute distress.    Appearance: Normal appearance. He is well-developed. He is obese. He is not diaphoretic.  HENT:     Head: Normocephalic and atraumatic.     Right Ear: Tympanic membrane, ear canal and external ear normal.     Left Ear: Tympanic membrane, ear canal and external ear normal.     Nose: Nose normal.     Mouth/Throat:     Mouth: Mucous membranes are moist.     Pharynx: Oropharynx is clear. No oropharyngeal exudate.  Eyes:     General: No scleral icterus.    Conjunctiva/sclera: Conjunctivae normal.     Pupils: Pupils are equal, round, and reactive to light.  Neck:     Thyroid: No thyromegaly.  Cardiovascular:     Rate and Rhythm: Normal rate and regular rhythm.     Pulses: Normal pulses.     Heart sounds: Normal heart sounds.  No murmur.  Pulmonary:     Effort: Pulmonary effort is normal. No respiratory distress.     Breath sounds: Normal breath sounds. No wheezing or rales.  Abdominal:     General: There is no distension.     Palpations: Abdomen is soft.     Tenderness: There is no abdominal tenderness. There is no guarding or rebound.  Musculoskeletal:        General: No deformity.     Right shoulder: Tenderness (over subacromial space) present. No swelling or deformity. Decreased range of motion. Normal strength.     Left shoulder: Tenderness (over subacromial space) present. No swelling or deformity. Decreased range of motion. Normal strength.     Cervical back: Neck supple.     Right lower leg: No edema.     Left lower leg: No edema.     Comments: Decreased abduction and internal/external rotation of bilateral shoulders (L>R). Pain with resisted external rotation b/l.  Clunk appreciated in R shoulder.    Lymphadenopathy:     Cervical: No cervical adenopathy.  Skin:    General: Skin is warm and dry.     Findings: No rash.  Neurological:     Mental Status: He is alert and oriented to person, place, and time. Mental status is at baseline.  Psychiatric:        Mood and Affect: Mood normal.        Behavior: Behavior normal.        Thought Content: Thought content normal.       Depression Screen  PHQ 2/9 Scores 11/03/2019 08/03/2019 07/07/2018  PHQ - 2 Score 0 0 0  PHQ- 9 Score 1 - -    Results for orders placed or performed in visit on 11/03/19  Comprehensive metabolic panel  Result Value Ref Range   Glucose 136 (H) 65 -  99 mg/dL   BUN 16 6 - 24 mg/dL   Creatinine, Ser 0.88 0.76 - 1.27 mg/dL   GFR calc non Af Amer 95 >59 mL/min/1.73   GFR calc Af Amer 109 >59 mL/min/1.73   BUN/Creatinine Ratio 18 9 - 20   Sodium 139 134 - 144 mmol/L   Potassium 4.5 3.5 - 5.2 mmol/L   Chloride 104 96 - 106 mmol/L   CO2 19 (L) 20 - 29 mmol/L   Calcium 9.1 8.7 - 10.2 mg/dL   Total Protein 6.4 6.0 - 8.5 g/dL    Albumin 4.3 3.8 - 4.9 g/dL   Globulin, Total 2.1 1.5 - 4.5 g/dL   Albumin/Globulin Ratio 2.0 1.2 - 2.2   Bilirubin Total 1.8 (H) 0.0 - 1.2 mg/dL   Alkaline Phosphatase 81 39 - 117 IU/L   AST 11 0 - 40 IU/L   ALT 11 0 - 44 IU/L  Hepatitis C antibody  Result Value Ref Range   Hep C Virus Ab <0.1 0.0 - 0.9 s/co ratio  Lipid Panel With LDL/HDL Ratio  Result Value Ref Range   Cholesterol, Total 151 100 - 199 mg/dL   Triglycerides 65 0 - 149 mg/dL   HDL 48 >39 mg/dL   VLDL Cholesterol Cal 13 5 - 40 mg/dL   LDL Chol Calc (NIH) 90 0 - 99 mg/dL   LDL/HDL Ratio 1.9 0.0 - 3.6 ratio  POCT glycosylated hemoglobin (Hb A1C)  Result Value Ref Range   Hemoglobin A1C 7.1 (A) 4.0 - 5.6 %    Assessment & Plan    Routine Health Maintenance and Physical Exam  Exercise Activities and Dietary recommendations Goals   None     Immunization History  Administered Date(s) Administered  . Influenza Split 04/17/2009, 03/18/2011  . Influenza,inj,Quad PF,6+ Mos 03/24/2017, 04/06/2018  . Pneumococcal Polysaccharide-23 10/08/2018  . Td 02/24/2019  . Tdap 03/18/2011    Health Maintenance  Topic Date Due  . OPHTHALMOLOGY EXAM  Never done  . COVID-19 Vaccine (1) Never done  . COLONOSCOPY  04/13/2017  . FOOT EXAM  10/08/2019  . URINE MICROALBUMIN  10/08/2019  . INFLUENZA VACCINE  01/30/2020  . HEMOGLOBIN A1C  05/05/2020  . TETANUS/TDAP  02/23/2029  . PNEUMOCOCCAL POLYSACCHARIDE VACCINE AGE 56-64 HIGH RISK  Completed  . Hepatitis C Screening  Completed  . HIV Screening  Completed    Discussed health benefits of physical activity, and encouraged him to engage in regular exercise appropriate for his age and condition.  Problem List Items Addressed This Visit      Cardiovascular and Mediastinum   Hypertension associated with diabetes ([place])    Borderline today.   Pt reports blood pressures at home are 120's/80's No changes today, but continue to monitor at home.  Not currently on meds - diet  controlled      Relevant Orders   Comprehensive metabolic panel (Completed)     Endocrine   T2DM (type 2 diabetes mellitus) ([place])    A1C stable at 7.1.   Associated with HTN and HLD Advised pt to continue to work on lifestyle changes.  Has declined adding another medication Continue metformin Will consider a second medication in three months if A1C is still elevated.  decliens eye exam and statin Otherwise UTD on screenings/vaccines       Relevant Orders   POCT glycosylated hemoglobin (Hb A1C) (Completed)   Hyperlipidemia associated with type 2 diabetes mellitus ([place])    Reviewed last lipid panel Not currently on a  statin - has declined previously Recheck FLP and CMP Discussed diet and exercise LDL Goal less than 70      Relevant Orders   Lipid Panel With LDL/HDL Ratio (Completed)     Other   Morbid obesity (HCC)    Discussed importance of healthy weight management  Discussed lifestyle changes. Congratulated on weight loss.          Colon cancer screening    Pt declined a colonoscopy today, but agreed to a cologuard.  Will place order today.        Relevant Orders   Cologuard    Other Visit Diagnoses    Annual physical exam    -  Primary   Relevant Orders   Comprehensive metabolic panel (Completed)   Lipid Panel With LDL/HDL Ratio (Completed)   Need for hepatitis C screening test       Relevant Orders   Hepatitis C antibody (Completed)       Return in about 3 months (around 02/03/2020) for Diabetes check.     I, Shirlee LatchAngela Nael Petrosyan, MD, have reviewed all documentation for this visit. The documentation on 11/04/19 for the exam, diagnosis, procedures, and orders are all accurate and complete.   Nicolis Boody, Marzella SchleinAngela M, MD, MPH [facility]

## 2019-11-03 ENCOUNTER — Other Ambulatory Visit: Payer: Self-pay

## 2019-11-03 ENCOUNTER — Ambulatory Visit (INDEPENDENT_AMBULATORY_CARE_PROVIDER_SITE_OTHER): Payer: BC Managed Care – PPO | Admitting: Family Medicine

## 2019-11-03 ENCOUNTER — Encounter: Payer: Self-pay | Admitting: Family Medicine

## 2019-11-03 VITALS — BP 139/89 | HR 90 | Temp 96.0°F | Ht 70.0 in | Wt 271.0 lb

## 2019-11-03 DIAGNOSIS — E785 Hyperlipidemia, unspecified: Secondary | ICD-10-CM | POA: Diagnosis not present

## 2019-11-03 DIAGNOSIS — Z1159 Encounter for screening for other viral diseases: Secondary | ICD-10-CM | POA: Diagnosis not present

## 2019-11-03 DIAGNOSIS — I152 Hypertension secondary to endocrine disorders: Secondary | ICD-10-CM

## 2019-11-03 DIAGNOSIS — M25511 Pain in right shoulder: Secondary | ICD-10-CM | POA: Diagnosis not present

## 2019-11-03 DIAGNOSIS — Z0000 Encounter for general adult medical examination without abnormal findings: Secondary | ICD-10-CM | POA: Diagnosis not present

## 2019-11-03 DIAGNOSIS — I10 Essential (primary) hypertension: Secondary | ICD-10-CM

## 2019-11-03 DIAGNOSIS — E1159 Type 2 diabetes mellitus with other circulatory complications: Secondary | ICD-10-CM

## 2019-11-03 DIAGNOSIS — E1169 Type 2 diabetes mellitus with other specified complication: Secondary | ICD-10-CM

## 2019-11-03 DIAGNOSIS — G8929 Other chronic pain: Secondary | ICD-10-CM | POA: Insufficient documentation

## 2019-11-03 DIAGNOSIS — M25512 Pain in left shoulder: Secondary | ICD-10-CM

## 2019-11-03 DIAGNOSIS — Z1211 Encounter for screening for malignant neoplasm of colon: Secondary | ICD-10-CM | POA: Insufficient documentation

## 2019-11-03 LAB — POCT GLYCOSYLATED HEMOGLOBIN (HGB A1C): Hemoglobin A1C: 7.1 % — AB (ref 4.0–5.6)

## 2019-11-03 NOTE — Patient Instructions (Addendum)
 The CDC recommends two doses of Shingrix (the shingles vaccine) separated by 2 to 6 months for adults age 59 years and older. I recommend checking with your insurance plan regarding coverage for this vaccine.     Preventive Care 40-64 Years Old, Male Preventive care refers to lifestyle choices and visits with your health care provider that can promote health and wellness. This includes:  A yearly physical exam. This is also called an annual well check.  Regular dental and eye exams.  Immunizations.  Screening for certain conditions.  Healthy lifestyle choices, such as eating a healthy diet, getting regular exercise, not using drugs or products that contain nicotine and tobacco, and limiting alcohol use. What can I expect for my preventive care visit? Physical exam Your health care provider will check:  Height and weight. These may be used to calculate body mass index (BMI), which is a measurement that tells if you are at a healthy weight.  Heart rate and blood pressure.  Your skin for abnormal spots. Counseling Your health care provider may ask you questions about:  Alcohol, tobacco, and drug use.  Emotional well-being.  Home and relationship well-being.  Sexual activity.  Eating habits.  Work and work environment. What immunizations do I need?  Influenza (flu) vaccine  This is recommended every year. Tetanus, diphtheria, and pertussis (Tdap) vaccine  You may need a Td booster every 10 years. Varicella (chickenpox) vaccine  You may need this vaccine if you have not already been vaccinated. Zoster (shingles) vaccine  You may need this after age 60. Measles, mumps, and rubella (MMR) vaccine  You may need at least one dose of MMR if you were born in 1957 or later. You may also need a second dose. Pneumococcal conjugate (PCV13) vaccine  You may need this if you have certain conditions and were not previously vaccinated. Pneumococcal polysaccharide (PPSV23)  vaccine  You may need one or two doses if you smoke cigarettes or if you have certain conditions. Meningococcal conjugate (MenACWY) vaccine  You may need this if you have certain conditions. Hepatitis A vaccine  You may need this if you have certain conditions or if you travel or work in places where you may be exposed to hepatitis A. Hepatitis B vaccine  You may need this if you have certain conditions or if you travel or work in places where you may be exposed to hepatitis B. Haemophilus influenzae type b (Hib) vaccine  You may need this if you have certain risk factors. Human papillomavirus (HPV) vaccine  If recommended by your health care provider, you may need three doses over 6 months. You may receive vaccines as individual doses or as more than one vaccine together in one shot (combination vaccines). Talk with your health care provider about the risks and benefits of combination vaccines. What tests do I need? Blood tests  Lipid and cholesterol levels. These may be checked every 5 years, or more frequently if you are over 59 years old.  Hepatitis C test.  Hepatitis B test. Screening  Lung cancer screening. You may have this screening every year starting at age 55 if you have a 30-pack-year history of smoking and currently smoke or have quit within the past 15 years.  Prostate cancer screening. Recommendations will vary depending on your family history and other risks.  Colorectal cancer screening. All adults should have this screening starting at age 59 and continuing until age 75. Your health care provider may recommend screening at age 45   if you are at increased risk. You will have tests every 1-10 years, depending on your results and the type of screening test.  Diabetes screening. This is done by checking your blood sugar (glucose) after you have not eaten for a while (fasting). You may have this done every 1-3 years.  Sexually transmitted disease (STD)  testing. Follow these instructions at home: Eating and drinking  Eat a diet that includes fresh fruits and vegetables, whole grains, lean protein, and low-fat dairy products.  Take vitamin and mineral supplements as recommended by your health care provider.  Do not drink alcohol if your health care provider tells you not to drink.  If you drink alcohol: ? Limit how much you have to 0-2 drinks a day. ? Be aware of how much alcohol is in your drink. In the [place] one drink equals one 12 oz bottle of beer (355 mL), one 5 oz glass of wine (148 mL), or one 1 oz glass of hard liquor (44 mL). Lifestyle  Take daily care of your teeth and gums.  Stay active. Exercise for at least 30 minutes on 5 or more days each week.  Do not use any products that contain nicotine or tobacco, such as cigarettes, e-cigarettes, and chewing tobacco. If you need help quitting, ask your health care provider.  If you are sexually active, practice safe sex. Use a condom or other form of protection to prevent STIs (sexually transmitted infections).  Talk with your health care provider about taking a low-dose aspirin every day starting at age 19. What's next?  Go to your health care provider once a year for a well check visit.  Ask your health care provider how often you should have your eyes and teeth checked.  Stay up to date on all vaccines. This information is not intended to replace advice given to you by your health care provider. Make sure you discuss any questions you have with your health care provider. Document Revised: 06/11/2018 Document Reviewed: 06/11/2018 Elsevier Patient Education  2020 [employer].

## 2019-11-03 NOTE — Assessment & Plan Note (Signed)
Discussed importance of healthy weight management  Discussed lifestyle changes. Congratulated on weight loss.

## 2019-11-03 NOTE — Assessment & Plan Note (Addendum)
Borderline today.   Pt reports blood pressures at home are 120's/80's No changes today, but continue to monitor at home.  Not currently on meds - diet controlled

## 2019-11-03 NOTE — Assessment & Plan Note (Addendum)
A1C stable at 7.1.   Associated with HTN and HLD Advised pt to continue to work on lifestyle changes.  Has declined adding another medication Continue metformin Will consider a second medication in three months if A1C is still elevated.  decliens eye exam and statin Otherwise UTD on screenings/vaccines

## 2019-11-03 NOTE — Assessment & Plan Note (Addendum)
Reviewed last lipid panel Not currently on a statin - has declined previously Recheck FLP and CMP Discussed diet and exercise LDL Goal less than 70

## 2019-11-03 NOTE — Assessment & Plan Note (Signed)
Pt declined a colonoscopy today, but agreed to a cologuard.  Will place order today.

## 2019-11-03 NOTE — Assessment & Plan Note (Addendum)
Pt declined referral to orthopedic at this time, secondary to cost.  Advised pt to call back if he decides to proceed with the referral.  Discussed concern for rotator cuff pathology and possible labral clunk  Discussed recommendation to see Ortho

## 2019-11-04 ENCOUNTER — Telehealth: Payer: Self-pay

## 2019-11-04 LAB — COMPREHENSIVE METABOLIC PANEL
ALT: 11 IU/L (ref 0–44)
AST: 11 IU/L (ref 0–40)
Albumin/Globulin Ratio: 2 (ref 1.2–2.2)
Albumin: 4.3 g/dL (ref 3.8–4.9)
Alkaline Phosphatase: 81 IU/L (ref 39–117)
BUN/Creatinine Ratio: 18 (ref 9–20)
BUN: 16 mg/dL (ref 6–24)
Bilirubin Total: 1.8 mg/dL — ABNORMAL HIGH (ref 0.0–1.2)
CO2: 19 mmol/L — ABNORMAL LOW (ref 20–29)
Calcium: 9.1 mg/dL (ref 8.7–10.2)
Chloride: 104 mmol/L (ref 96–106)
Creatinine, Ser: 0.88 mg/dL (ref 0.76–1.27)
GFR calc Af Amer: 109 mL/min/{1.73_m2} (ref >59)
GFR calc non Af Amer: 95 mL/min/{1.73_m2} (ref >59)
Globulin, Total: 2.1 g/dL (ref 1.5–4.5)
Glucose: 136 mg/dL — ABNORMAL HIGH (ref 65–99)
Potassium: 4.5 mmol/L (ref 3.5–5.2)
Sodium: 139 mmol/L (ref 134–144)
Total Protein: 6.4 g/dL (ref 6.0–8.5)

## 2019-11-04 LAB — LIPID PANEL WITH LDL/HDL RATIO
Cholesterol, Total: 151 mg/dL (ref 100–199)
HDL: 48 mg/dL (ref >39)
LDL Chol Calc (NIH): 90 mg/dL (ref 0–99)
LDL/HDL Ratio: 1.9 ratio (ref 0.0–3.6)
Triglycerides: 65 mg/dL (ref 0–149)
VLDL Cholesterol Cal: 13 mg/dL (ref 5–40)

## 2019-11-04 LAB — HEPATITIS C ANTIBODY: Hep C Virus Ab: <0.1 s/co ratio (ref 0.0–0.9)

## 2019-11-04 NOTE — Telephone Encounter (Signed)
-----   Message from Erasmo Downer, MD sent at 11/04/2019  8:16 AM EDT ----- Bilirubin is high.  Can we see if [facility] can add on a direct and indirect bilirubin please.  Cholesterol remains above goal in diabetes.  Again recommend statin to lower heart disease/stroke risk.  Other labs normal

## 2019-11-04 NOTE — Telephone Encounter (Signed)
LMTCB, PEC may give result for lipids. Called [facility] to add additional test.

## 2019-11-05 NOTE — Telephone Encounter (Signed)
Pt given lab results per notes of Dr. Beryle Flock on 11/04/19. Pt verbalized understanding.Pt states he still wants to hold off on starting a statin at this time.

## 2019-11-05 NOTE — Telephone Encounter (Signed)
FYI

## 2019-11-10 ENCOUNTER — Other Ambulatory Visit: Payer: Self-pay | Admitting: Family Medicine

## 2019-11-10 ENCOUNTER — Telehealth: Payer: Self-pay

## 2019-11-10 DIAGNOSIS — R17 Unspecified jaundice: Secondary | ICD-10-CM

## 2019-11-10 NOTE — Telephone Encounter (Signed)
Called to advise patient on labs, no answer LVMTCB. If patient calls back it is okay for someone else to advise.

## 2019-11-10 NOTE — Telephone Encounter (Signed)
Patient notified of lab results and PCP recommendations. Patient states he sees Dr Markham Jordan at [facility] for his GI.

## 2019-11-10 NOTE — Telephone Encounter (Signed)
-----   Message from Erasmo Downer, MD sent at 11/10/2019  8:41 AM EDT ----- Indirect bilirubin is high, which can be from inherited conditions or external factors. Needs to see GI. Will place referral.

## 2019-11-10 NOTE — Telephone Encounter (Signed)
Referral has been placed for patient

## 2019-11-26 LAB — SPECIMEN STATUS REPORT

## 2019-11-26 LAB — BILIRUBIN, FRACTIONATED(TOT/DIR/INDIR)
Bilirubin Total: 1.6 mg/dL — ABNORMAL HIGH (ref 0.0–1.2)
Bilirubin, Direct: 0.37 mg/dL (ref 0.00–0.40)
Bilirubin, Indirect: 1.23 mg/dL — ABNORMAL HIGH (ref 0.10–0.80)

## 2020-02-07 ENCOUNTER — Ambulatory Visit: Payer: BC Managed Care – PPO | Admitting: Family Medicine

## 2020-03-08 ENCOUNTER — Other Ambulatory Visit: Payer: Self-pay | Admitting: Family Medicine

## 2020-03-08 DIAGNOSIS — E119 Type 2 diabetes mellitus without complications: Secondary | ICD-10-CM

## 2020-03-08 MED ORDER — METFORMIN HCL 1000 MG PO TABS: 1000.0000 mg | ORAL_TABLET | Freq: Two times a day (BID) | ORAL | 0 refills | Status: DC

## 2020-03-08 NOTE — Telephone Encounter (Signed)
Approved per protocol. Requested Prescriptions  Pending Prescriptions Disp Refills  . metFORMIN (GLUCOPHAGE) 1000 MG tablet 180 tablet 0    Sig: Take 1 tablet (1,000 mg total) by mouth 2 (two) times daily with a meal.     Endocrinology:  Diabetes - Biguanides Passed - 03/08/2020 10:59 AM      Passed - Cr in normal range and within 360 days    Creat  Date Value Ref Range Status  03/10/2017 0.86 0.70 - 1.33 mg/dL Final    Comment:    For patients >81 years of age, the reference limit for Creatinine is approximately 13% higher for people identified as African-American. .    Creatinine, Ser  Date Value Ref Range Status  11/03/2019 0.88 0.76 - 1.27 mg/dL Final         Passed - HBA1C is between 0 and 7.9 and within 180 days    Hemoglobin A1C  Date Value Ref Range Status  11/03/2019 7.1 (A) 4.0 - 5.6 % Final   Hgb A1c MFr Bld  Date Value Ref Range Status  02/24/2019 6.9 (H) 4.8 - 5.6 % Final    Comment:             Prediabetes: 5.7 - 6.4          Diabetes: >6.4          Glycemic control for adults with diabetes: <7.0          Passed - eGFR in normal range and within 360 days    GFR calc Af Amer  Date Value Ref Range Status  11/03/2019 109 >59 mL/min/1.73 Final    Comment:    **[facility] currently reports eGFR in compliance with the current**   recommendations of the [employer]. [facility] will   update reporting as new guidelines are published from the NKF-ASN   Task force.    GFR calc non Af Amer  Date Value Ref Range Status  11/03/2019 95 >59 mL/min/1.73 Final         Passed - Valid encounter within last 6 months    Recent Outpatient Visits          4 months ago Annual physical exam   [facility] [place], Dionne Bucy, MD   7 months ago Type 2 diabetes mellitus with other specified complication, without long-term current use of insulin [facility])   [facility] [place], Dionne Bucy, MD   1 year ago Diabetes mellitus without  complication [facility])   [place], [place], PA-C   1 year ago Diabetes mellitus without complication [facility])   [place], [place], PA-C   1 year ago Diabetes mellitus without complication [facility])   [facility] [place], Herbie [place], [place]      Future Appointments            In 1 month [place], Dionne Bucy, MD [facility], [place]

## 2020-03-08 NOTE — Telephone Encounter (Signed)
Medication Refill - Medication: metFORMIN (GLUCOPHAGE) 1000 MG tablet ° ° ° °Preferred Pharmacy (with phone number or street name):  °[id number] - [place], [place] - [address] AT [address] OF [address] Phone:  [phone]  °Fax:  [phone]  °  ° ° ° °Agent: Please be advised that RX refills may take up to 3 business days. We ask that you follow-up with your pharmacy. ° °

## 2020-03-15 ENCOUNTER — Ambulatory Visit: Payer: BC Managed Care – PPO | Admitting: Family Medicine

## 2020-04-17 ENCOUNTER — Ambulatory Visit: Payer: BC Managed Care – PPO | Admitting: Family Medicine

## 2020-06-05 ENCOUNTER — Other Ambulatory Visit: Payer: Self-pay

## 2020-06-05 ENCOUNTER — Ambulatory Visit (INDEPENDENT_AMBULATORY_CARE_PROVIDER_SITE_OTHER): Payer: BC Managed Care – PPO | Admitting: Family Medicine

## 2020-06-05 ENCOUNTER — Encounter: Payer: Self-pay | Admitting: Family Medicine

## 2020-06-05 VITALS — BP 138/82 | HR 94 | Temp 98.1°F | Resp 16 | Ht 70.0 in | Wt 280.0 lb

## 2020-06-05 DIAGNOSIS — L720 Epidermal cyst: Secondary | ICD-10-CM

## 2020-06-05 DIAGNOSIS — E1169 Type 2 diabetes mellitus with other specified complication: Secondary | ICD-10-CM

## 2020-06-05 DIAGNOSIS — E1159 Type 2 diabetes mellitus with other circulatory complications: Secondary | ICD-10-CM | POA: Diagnosis not present

## 2020-06-05 DIAGNOSIS — E785 Hyperlipidemia, unspecified: Secondary | ICD-10-CM

## 2020-06-05 DIAGNOSIS — Z6841 Body Mass Index (BMI) 40.0 and over, adult: Secondary | ICD-10-CM | POA: Diagnosis not present

## 2020-06-05 DIAGNOSIS — M5126 Other intervertebral disc displacement, lumbar region: Secondary | ICD-10-CM

## 2020-06-05 DIAGNOSIS — J4520 Mild intermittent asthma, uncomplicated: Secondary | ICD-10-CM

## 2020-06-05 DIAGNOSIS — I152 Hypertension secondary to endocrine disorders: Secondary | ICD-10-CM

## 2020-06-05 LAB — POCT GLYCOSYLATED HEMOGLOBIN (HGB A1C)
Est. average glucose Bld gHb Est-mCnc: 163
Hemoglobin A1C: 7.3 % — AB (ref 4.0–5.6)

## 2020-06-05 LAB — POCT UA - MICROALBUMIN: Microalbumin Ur, POC: 50 mg/L

## 2020-06-05 MED ORDER — METHOCARBAMOL 750 MG PO TABS: ORAL_TABLET | ORAL | 1 refills | Status: DC

## 2020-06-05 MED ORDER — DAPAGLIFLOZIN PROPANEDIOL 5 MG PO TABS
5.0000 mg | ORAL_TABLET | Freq: Every day | ORAL | 5 refills | Status: DC
Start: 2020-06-05 — End: 2020-09-05

## 2020-06-05 MED ORDER — METHOCARBAMOL 750 MG PO TABS: ORAL_TABLET | ORAL | 3 refills | Status: AC

## 2020-06-05 MED ORDER — METFORMIN HCL 1000 MG PO TABS: 1000.0000 mg | ORAL_TABLET | Freq: Two times a day (BID) | ORAL | 1 refills | Status: DC

## 2020-06-05 MED ORDER — ALBUTEROL SULFATE HFA 108 (90 BASE) MCG/ACT IN AERS: 2.0000 | INHALATION_SPRAY | Freq: Four times a day (QID) | RESPIRATORY_TRACT | 0 refills | Status: AC | PRN

## 2020-06-05 NOTE — Patient Instructions (Signed)
Congratulations on your Covid-19 vaccination!  Please bring in your Covid-19 vaccination card to your next appointment or upload a picture to your MyChart for us to keep track of your vaccinations.  Thank you!     

## 2020-06-05 NOTE — Assessment & Plan Note (Signed)
Referral to dermatology for further management

## 2020-06-05 NOTE — Assessment & Plan Note (Signed)
Reports childhood asthma that he "has grown out of twice" Slight wheeze today Trial of albuterol prn

## 2020-06-05 NOTE — Assessment & Plan Note (Addendum)
Well controlled Continue current medications Declines repeat labs today F/u in 6 months

## 2020-06-05 NOTE — Assessment & Plan Note (Signed)
New referral to GI placed today Patient prefers [facility]

## 2020-06-05 NOTE — Assessment & Plan Note (Signed)
Not currently on a statin-has repeatedly declined Declines repeat labs today

## 2020-06-05 NOTE — Assessment & Plan Note (Signed)
Uncontrolled/hyperglycemia with A1c 7.3 Associated with HTN and HLD as well as morbid obesity Discussed continued work on lifestyle changes Continue metformin at current dose Add Farxiga 5 mg daily Urine microalbumin today Advised on need for eye exam States he did not tolerate Trulicity previously due to stomachache Follow-up in 3 months and repeat A1c

## 2020-06-05 NOTE — Assessment & Plan Note (Signed)
Discussed importance of healthy weight management Discussed diet and exercise  

## 2020-06-05 NOTE — Progress Notes (Signed)
Established patient visit   Patient: Dean Valentine   DOB: [birth year]   59 y.o. Male  MRN: [id number] Visit Date: 06/05/2020  Today's healthcare provider: Shirlee Latch, MD   Chief Complaint  Patient presents with  . Diabetes  . Cyst   Subjective    HPI  Diabetes Mellitus Type II, Follow-up  Lab Results  Component Value Date   HGBA1C 7.3 (A) 06/05/2020   HGBA1C 7.1 (A) 11/03/2019   HGBA1C 7.1 (A) 08/03/2019   Wt Readings from Last 3 Encounters:  06/05/20 280 lb (127 kg)  11/03/19 271 lb (122.9 kg)  08/03/19 290 lb (131.5 kg)   Last seen for diabetes 6 months ago.  Management since then includes no changes. He reports excellent compliance with treatment. He is not having side effects.  Symptoms: No fatigue No foot ulcerations  No appetite changes No nausea  No paresthesia of the feet  No polydipsia  No polyuria No visual disturbances   No vomiting     Home blood sugar records: not being checked  Episodes of hypoglycemia? No    Current insulin regiment: none Most Recent Eye Exam: none Current exercise: none Current diet habits: in general, an "unhealthy" diet  Pertinent Labs: Lab Results  Component Value Date   CHOL 151 11/03/2019   HDL 48 11/03/2019   LDLCALC 90 11/03/2019   TRIG 65 11/03/2019   CHOLHDL 3.4 10/08/2018   Lab Results  Component Value Date   NA 139 11/03/2019   K 4.5 11/03/2019   CREATININE 0.88 11/03/2019   GFRNONAA 95 11/03/2019   GFRAA 109 11/03/2019   GLUCOSE 136 (H) 11/03/2019     --------------------------------------------------------------------------------------------------- Patient c/o cyst on back x several years. Patient reports pain and itching in the last few weeks. Patient denies any discharge.     Patient Active Problem List   Diagnosis Date Noted  . Mild intermittent asthma without complication 06/05/2020  . Epidermal inclusion cyst 06/05/2020  . Indirect hyperbilirubinemia 06/05/2020  . Colon cancer  screening 11/03/2019  . Chronic pain of both shoulders 11/03/2019  . Hypertension associated with diabetes (HCC) 10/08/2018  . Hyperlipidemia associated with type 2 diabetes mellitus (HCC) 10/08/2018  . Morbid obesity (HCC) 10/08/2018  . Chronic pain of left knee 07/07/2018  . Left carpal tunnel syndrome 07/07/2018  . 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 Allergic rhinitis 10/30/2015  . Diverticulosis 10/30/2015  . H/O: duodenal ulcer 10/30/2015  . T2DM (type 2 diabetes mellitus) (HCC) 10/30/2015  . Acute duodenal ulcer with hemorrhage 09/17/2007  . Compulsive tobacco user syndrome 04/22/2007  . Colon, diverticulosis 04/22/2007   Social History   Tobacco Use  . Smoking status: Never Smoker  . Smokeless tobacco: Current User    Types: Chew  . Tobacco comment: has quit smokeless tobacco in past 3x  Vaping Use  . Vaping Use: Never used  Substance Use Topics  . Alcohol use: Yes    Alcohol/week: 0.0 standard drinks    Comment: occasional  . Drug use: No   Allergies  Allergen Reactions  . Aspirin     G.I. bleeding  . Ibuprofen     G.I. bleeding       Medications: Outpatient Medications Prior to Visit  Medication Sig  . glucose blood test strip Check a fasting sugar in the morning and 2 hours after a meal 2 x week.  . Levocetirizine Dihydrochloride (XYZAL PO) Take by mouth.  . Triamcinolone Acetonide (NASACORT AQ  NA) Place into the nose.  . [DISCONTINUED] metFORMIN (GLUCOPHAGE) 1000 MG tablet Take 1 tablet (1,000 mg total) by mouth 2 (two) times daily with a meal.  . [DISCONTINUED] methocarbamol (ROBAXIN) 750 MG tablet One or two 4 x day as needed for back spasms   Facility-Administered Medications Prior to Visit  Medication Dose Route Frequency Provider  . betamethasone acetate-betamethasone sodium phosphate (CELESTONE) injection 3 mg  3 mg Intramuscular Once Gala Lewandowsky M, DPM  . betamethasone acetate-betamethasone sodium phosphate  (CELESTONE) injection 3 mg  3 mg Intramuscular Once Felecia Shelling, DPM    Review of Systems  Constitutional: Negative for activity change, appetite change and fatigue.  Respiratory: Negative for chest tightness and shortness of breath.   Cardiovascular: Negative for chest pain and palpitations.  Skin: Positive for rash.    Last CBC Lab Results  Component Value Date   WBC 6.2 10/08/2018   HGB 15.6 10/08/2018   HCT 46.4 10/08/2018   MCV 86 10/08/2018   MCH 28.8 10/08/2018   RDW 13.0 10/08/2018   PLT 270 10/08/2018      Objective    BP 138/82 (BP Location: Left Arm, Patient Position: Sitting, Cuff Size: Large)   Pulse 94   Temp 98.1 F (36.7 C) (Oral)   Resp 16   Ht 5\' 10"  (1.778 m)   Wt 280 lb (127 kg)   SpO2 98%   BMI 40.18 kg/m  BP Readings from Last 3 Encounters:  06/05/20 138/82  11/03/19 139/89  08/03/19 (!) 143/91   Wt Readings from Last 3 Encounters:  06/05/20 280 lb (127 kg)  11/03/19 271 lb (122.9 kg)  08/03/19 290 lb (131.5 kg)      Physical Exam Vitals reviewed.  Constitutional:      General: He is not in acute distress.    Appearance: Normal appearance. He is not diaphoretic.  HENT:     Head: Normocephalic and atraumatic.  Eyes:     General: No scleral icterus.    Conjunctiva/sclera: Conjunctivae normal.  Cardiovascular:     Rate and Rhythm: Normal rate and regular rhythm.     Pulses: Normal pulses.     Heart sounds: Normal heart sounds. No murmur heard.   Pulmonary:     Effort: Pulmonary effort is normal. No respiratory distress.     Breath sounds: Normal breath sounds. No wheezing or rhonchi.  Abdominal:     General: There is no distension.     Palpations: Abdomen is soft.     Tenderness: There is no abdominal tenderness.  Musculoskeletal:     Cervical back: Neck supple.     Right lower leg: No edema.     Left lower leg: No edema.  Lymphadenopathy:     Cervical: No cervical adenopathy.  Skin:    General: Skin is warm and dry.      Capillary Refill: Capillary refill takes less than 2 seconds.     Findings: No rash.     Comments: Quarter sized epidermal inclusion cyst. No erythema or drainage  Neurological:     Mental Status: He is alert and oriented to person, place, and time.     Cranial Nerves: No cranial nerve deficit.  Psychiatric:        Mood and Affect: Mood normal.        Behavior: Behavior normal.       Results for orders placed or performed in visit on 06/05/20  POCT glycosylated hemoglobin (Hb A1C)  Result Value Ref Range  Hemoglobin A1C 7.3 (A) 4.0 - 5.6 %   Est. average glucose Bld gHb Est-mCnc 163   POCT UA - Microalbumin  Result Value Ref Range   Microalbumin Ur, POC 50 mg/L    Assessment & Plan     Problem List Items Addressed This Visit      Cardiovascular and Mediastinum   Hypertension associated with diabetes (HCC)    Well controlled Continue current medications Declines repeat labs today F/u in 6 months       Relevant Medications   metFORMIN (GLUCOPHAGE) 1000 MG tablet   dapagliflozin propanediol (FARXIGA) 5 MG TABS tablet     Respiratory   Mild intermittent asthma without complication    Reports childhood asthma that he "has grown out of twice" Slight wheeze today Trial of albuterol prn      Relevant Medications   albuterol (VENTOLIN HFA) 108 (90 Base) MCG/ACT inhaler     Endocrine   T2DM (type 2 diabetes mellitus) (HCC) - Primary    Uncontrolled/hyperglycemia with A1c 7.3 Associated with HTN and HLD as well as morbid obesity Discussed continued work on lifestyle changes Continue metformin at current dose Add Farxiga 5 mg daily Urine microalbumin today Advised on need for eye exam States he did not tolerate Trulicity previously due to stomachache Follow-up in 3 months and repeat A1c      Relevant Medications   metFORMIN (GLUCOPHAGE) 1000 MG tablet   dapagliflozin propanediol (FARXIGA) 5 MG TABS tablet   Other Relevant Orders   POCT glycosylated hemoglobin  (Hb A1C) (Completed)   POCT UA - Microalbumin (Completed)   Hyperlipidemia associated with type 2 diabetes mellitus (HCC)    Not currently on a statin-has repeatedly declined Declines repeat labs today      Relevant Medications   metFORMIN (GLUCOPHAGE) 1000 MG tablet   dapagliflozin propanediol (FARXIGA) 5 MG TABS tablet     Musculoskeletal and Integument   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   Relevant Medications   methocarbamol (ROBAXIN) 750 MG tablet     Other   Morbid obesity (HCC)    Discussed importance of healthy weight management Discussed diet and exercise       Relevant Medications   metFORMIN (GLUCOPHAGE) 1000 MG tablet   dapagliflozin propanediol (FARXIGA) 5 MG TABS tablet   Epidermal inclusion cyst    Referral to dermatology for further management      Relevant Orders   Ambulatory referral to Dermatology   Indirect hyperbilirubinemia    New referral to GI placed today Patient prefers [facility]      Relevant Orders   Ambulatory referral to Gastroenterology    Other Visit Diagnoses    BMI 40.0-44.9, adult (HCC)       Relevant Medications   metFORMIN (GLUCOPHAGE) 1000 MG tablet   dapagliflozin propanediol (FARXIGA) 5 MG TABS tablet       Return in about 3 months (around 09/03/2020) for chronic disease f/u.      I, Shirlee Latch, MD, have reviewed all documentation for this visit. The documentation on 06/05/20 for the exam, diagnosis, procedures, and orders are all accurate and complete.   Lucillie Kiesel, Marzella Schlein, MD, MPH [facility]

## 2020-07-11 ENCOUNTER — Other Ambulatory Visit: Payer: Self-pay | Admitting: Gastroenterology

## 2020-07-11 DIAGNOSIS — R17 Unspecified jaundice: Secondary | ICD-10-CM | POA: Diagnosis not present

## 2020-07-18 ENCOUNTER — Ambulatory Visit
Admission: RE | Admit: 2020-07-18 | Discharge: 2020-07-18 | Disposition: A | Discharge status: Home / Self Care | Payer: BC Managed Care – PPO | Source: Ambulatory Visit | Attending: Gastroenterology | Admitting: Gastroenterology

## 2020-07-18 ENCOUNTER — Other Ambulatory Visit: Payer: Self-pay

## 2020-07-18 DIAGNOSIS — R17 Unspecified jaundice: Secondary | ICD-10-CM | POA: Insufficient documentation

## 2020-07-18 DIAGNOSIS — K7689 Other specified diseases of liver: Secondary | ICD-10-CM | POA: Diagnosis not present

## 2020-07-18 DIAGNOSIS — K8020 Calculus of gallbladder without cholecystitis without obstruction: Secondary | ICD-10-CM | POA: Diagnosis not present

## 2020-07-31 ENCOUNTER — Ambulatory Visit: Payer: Self-pay | Admitting: Surgery

## 2020-07-31 DIAGNOSIS — K8020 Calculus of gallbladder without cholecystitis without obstruction: Secondary | ICD-10-CM | POA: Diagnosis not present

## 2020-07-31 NOTE — H&P (Signed)
Subjective:   CC: Calculus of gallbladder without cholecystitis without obstruction [K80.20]  HPI:  Dean Valentine is a 60 y.o. male who was referred by Jeni Salles,* for evaluation of above CC. Symptoms were first noted several years ago. Pain is dull, achy and constant, radiating from the right upper quadrant, to the right flank.  Associated with nausea in pm prior to dinner, exacerbated by nothing specific.  Initially started as flank pain radiating to RUQ, now is more RUQ radiating to flank.     Past Medical History:  has a past medical history of Bleeding ulcer, Diabetes (CMS-HCC), Diverticulitis, and GERD (gastroesophageal reflux disease).  Past Surgical History:  has no past surgical history on file.  Family History: family history includes Ulcers in his maternal grandfather, mother, and sister.  Social History:  reports that he has never smoked. His smokeless tobacco use includes snuff. No history on file for alcohol use and drug use.  Current Medications: has a current medication list which includes the following prescription(s): albuterol, dapagliflozin, metformin, and methocarbamol.  Allergies:       Allergies as of 07/31/2020 - Reviewed 07/31/2020  Allergen Reaction Noted  . Asa,buffd(mag,aluminum hydrox) Other (See Comments) 07/11/2020  . Nsaids (non-steroidal anti-inflammatory drug) Other (See Comments) 07/11/2020    ROS:  A 15 point review of systems was performed and pertinent positives and negatives noted in HPI    Objective:   BP (!) 143/85   Pulse 97   Ht 177.8 cm (5\' 10" )   Wt (!) 128.4 kg (283 lb)   BMI 40.61 kg/m    Constitutional :  alert, appears stated age, cooperative and no distress  Lymphatics/Throat:  no asymmetry, masses, or scars  Respiratory:  clear to auscultation bilaterally  Cardiovascular:  regular rate and rhythm  Gastrointestinal: soft, no guarding, some TTP right along costal margin and tip of floating ribs.  also  along right paraspinal muslces at t10-t12 level.    Musculoskeletal: Steady gait and movement.  Pain reproducible with rotating to right.  Skin: Cool and moist  Psychiatric: Normal affect, non-agitated, not confused       LABS:     RADS: CLINICAL DATA: Elevated bilirubin   EXAM:  ULTRASOUND ABDOMEN LIMITED RIGHT UPPER QUADRANT   COMPARISON: None.   FINDINGS:  Gallbladder:   Multiple mobile gallstones the largest of which measures 1 cm. No  wall thickening visualized (3 mm). No sonographic Murphy sign noted  by sonographer.   Common bile duct:   Diameter: 2 mm   Liver:   No focal lesion identified. Increased hepatic parenchymal  echogenicity. Portal vein is patent on color Doppler imaging with  normal direction of blood flow towards the liver.   Other: None.   IMPRESSION:  1. Cholelithiasis without sonographic evidence of acute  cholecystitis.  2. The echogenicity of the liver is increased. This is a nonspecific  finding but is most commonly seen with fatty infiltration of the  liver. There are no obvious focal liver lesions.    Electronically Signed  By: MD  On: 07/18/2020 09:52  Assessment:      Calculus of gallbladder without cholecystitis without obstruction [K80.20]  Indirect hyperbilirubinema DM  Atypical presentation with reproducible pain with rotation, wt gain as possible cause of MSK related pain?  Recommended HIDA, but patient wishes to proceed with lap chole, understanding risk below.  Plan:   1. Calculus of gallbladder without cholecystitis without obstruction [K80.20] Discussed the risk of surgery including post-op infxn,  seroma, biloma, chronic pain, poor-delayed wound healing, retained gallstone, conversion to open procedure, post-op SBO or ileus, and need for additional procedures to address said risks.  The risks of general anesthetic including MI, CVA, sudden death or even reaction to anesthetic medications  also discussed. Alternatives include continued observation.  Benefits include possible symptom relief, prevention of complications including acute cholecystitis, pancreatitis.  Typical post operative recovery of 3-5 days rest, continued pain in area and incision sites, possible loose stools up to 4-6 weeks, also discussed.  ED return precautions given for sudden increase in RUQ pain, with possible accompanying fever, nausea, and/or vomiting.  The patient understands the risks, any and all questions were answered to the patient's satisfaction.  2. Patient has elected to proceed with surgical treatment. Procedure will be scheduled.  Written consent was obtained..robotic assisted laparoscopic.  No guarantee of symptom resolution due to atypical presentation.  Pt at higher perioperative risk from obesity, DM.  Continue current management at this point.  He is also due for a colonoscopy for which we can arrange once lap chole completed            

## 2020-07-31 NOTE — H&P (View-Only) (Signed)
Subjective:   CC: Calculus of gallbladder without cholecystitis without obstruction [K80.20]  HPI:  Dean Valentine is a 60 y.o. male who was referred by Jeni Salles,* for evaluation of above CC. Symptoms were first noted several years ago. Pain is dull, achy and constant, radiating from the right upper quadrant, to the right flank.  Associated with nausea in pm prior to dinner, exacerbated by nothing specific.  Initially started as flank pain radiating to RUQ, now is more RUQ radiating to flank.     Past Medical History:  has a past medical history of Bleeding ulcer, Diabetes (CMS-HCC), Diverticulitis, and GERD (gastroesophageal reflux disease).  Past Surgical History:  has no past surgical history on file.  Family History: family history includes Ulcers in his maternal grandfather, mother, and sister.  Social History:  reports that he has never smoked. His smokeless tobacco use includes snuff. No history on file for alcohol use and drug use.  Current Medications: has a current medication list which includes the following prescription(s): albuterol, dapagliflozin, metformin, and methocarbamol.  Allergies:       Allergies as of 07/31/2020 - Reviewed 07/31/2020  Allergen Reaction Noted  . Asa,buffd(mag,aluminum hydrox) Other (See Comments) 07/11/2020  . Nsaids (non-steroidal anti-inflammatory drug) Other (See Comments) 07/11/2020    ROS:  A 15 point review of systems was performed and pertinent positives and negatives noted in HPI    Objective:   BP (!) 143/85   Pulse 97   Ht 177.8 cm (5\' 10" )   Wt (!) 128.4 kg (283 lb)   BMI 40.61 kg/m    Constitutional :  alert, appears stated age, cooperative and no distress  Lymphatics/Throat:  no asymmetry, masses, or scars  Respiratory:  clear to auscultation bilaterally  Cardiovascular:  regular rate and rhythm  Gastrointestinal: soft, no guarding, some TTP right along costal margin and tip of floating ribs.  also  along right paraspinal muslces at t10-t12 level.    Musculoskeletal: Steady gait and movement.  Pain reproducible with rotating to right.  Skin: Cool and moist  Psychiatric: Normal affect, non-agitated, not confused       LABS:     RADS: CLINICAL DATA: Elevated bilirubin   EXAM:  ULTRASOUND ABDOMEN LIMITED RIGHT UPPER QUADRANT   COMPARISON: None.   FINDINGS:  Gallbladder:   Multiple mobile gallstones the largest of which measures 1 cm. No  wall thickening visualized (3 mm). No sonographic Murphy sign noted  by sonographer.   Common bile duct:   Diameter: 2 mm   Liver:   No focal lesion identified. Increased hepatic parenchymal  echogenicity. Portal vein is patent on color Doppler imaging with  normal direction of blood flow towards the liver.   Other: None.   IMPRESSION:  1. Cholelithiasis without sonographic evidence of acute  cholecystitis.  2. The echogenicity of the liver is increased. This is a nonspecific  finding but is most commonly seen with fatty infiltration of the  liver. There are no obvious focal liver lesions.    Electronically Signed  By: MD  On: 07/18/2020 09:52  Assessment:      Calculus of gallbladder without cholecystitis without obstruction [K80.20]  Indirect hyperbilirubinema DM  Atypical presentation with reproducible pain with rotation, wt gain as possible cause of MSK related pain?  Recommended HIDA, but patient wishes to proceed with lap chole, understanding risk below.  Plan:   1. Calculus of gallbladder without cholecystitis without obstruction [K80.20] Discussed the risk of surgery including post-op infxn,  seroma, biloma, chronic pain, poor-delayed wound healing, retained gallstone, conversion to open procedure, post-op SBO or ileus, and need for additional procedures to address said risks.  The risks of general anesthetic including MI, CVA, sudden death or even reaction to anesthetic medications  also discussed. Alternatives include continued observation.  Benefits include possible symptom relief, prevention of complications including acute cholecystitis, pancreatitis.  Typical post operative recovery of 3-5 days rest, continued pain in area and incision sites, possible loose stools up to 4-6 weeks, also discussed.  ED return precautions given for sudden increase in RUQ pain, with possible accompanying fever, nausea, and/or vomiting.  The patient understands the risks, any and all questions were answered to the patient's satisfaction.  2. Patient has elected to proceed with surgical treatment. Procedure will be scheduled.  Written consent was obtained..robotic assisted laparoscopic.  No guarantee of symptom resolution due to atypical presentation.  Pt at higher perioperative risk from obesity, DM.  Continue current management at this point.  He is also due for a colonoscopy for which we can arrange once lap chole completed

## 2020-08-01 ENCOUNTER — Inpatient Hospital Stay
Admission: RE | Admit: 2020-08-01 | Discharge: 2020-08-01 | Disposition: A | Discharge status: Home / Self Care | Payer: BC Managed Care – PPO | Source: Ambulatory Visit

## 2020-08-01 NOTE — Pre-Procedure Instructions (Signed)
Call to patient to do pre-op interview. Patient stated that he had fever, cough, sore throat this am and had a rapid covid test done at work today which came back positive. Instructed to attempt to get a copy of that result and to call Dr. Geoffery Lyons office to let them know that surgery needed to be cancelled. Acknowledged understanding. Dr. Geoffery Lyons office notified.

## 2020-08-01 NOTE — Patient Instructions (Signed)
Your procedure is scheduled on: Friday, February 4 Report to the Registration Desk on the 1st floor of the [facility] To find out your arrival time, please call [phone] between 1PM - 3PM on: Thursday, February 3  REMEMBER: Instructions that are not followed completely may result in serious medical risk, up to and including death; or upon the discretion of your surgeon and anesthesiologist your surgery may need to be rescheduled.  Do not eat food after midnight the night before surgery.  No gum chewing, lozengers or hard candies.  You may however, drink water up to 2 hours before you are scheduled to arrive for your surgery. Do not drink anything within 2 hours of your scheduled arrival time.  TAKE THESE MEDICATIONS THE MORNING OF SURGERY WITH A SIP OF WATER:  1.  Albuterol inhaler  Use inhalers on the day of surgery and bring to the hospital.  Stop Metformin 2 days prior to surgery. Last day to take is Tuesday, February 1; resume after surgery.  One week prior to surgery: now Stop Anti-inflammatories (NSAIDS) such as Advil, Aleve, Ibuprofen, Motrin, Naproxen, Naprosyn and Aspirin based products such as Excedrin, Goodys Powder, BC Powder. Stop ANY OVER THE COUNTER supplements until after surgery.  No Alcohol for 24 hours before or after surgery.  No Smoking including e-cigarettes for 24 hours prior to surgery.  No chewable tobacco products for at least 6 hours prior to surgery.  No nicotine patches on the day of surgery.  Do not use any "recreational" drugs for at least a week prior to your surgery.  Please be advised that the combination of cocaine and anesthesia may have negative outcomes, up to and including death. If you test positive for cocaine, your surgery will be cancelled.  On the morning of surgery brush your teeth with toothpaste and water, you may rinse your mouth with mouthwash if you wish. Do not swallow any toothpaste or mouthwash.  Do not wear jewelry,  make-up, hairpins, clips or nail polish.  Do not wear lotions, powders, or perfumes.   Do not shave body from the neck down 48 hours prior to surgery just in case you cut yourself which could leave a site for infection.  Also, freshly shaved skin may become irritated if using the CHG soap.  Contact lenses, hearing aids and dentures may not be worn into surgery.  Do not bring valuables to the hospital. [facility] is not responsible for any missing/lost belongings or valuables.   Use CHG Soap as directed on instruction sheet.  Bring your C-PAP to the hospital with you in case you may have to spend the night.   Notify your doctor if there is any change in your medical condition (cold, fever, infection).  Wear comfortable clothing (specific to your surgery type) to the hospital.  Plan for stool softeners for home use; pain medications have a tendency to cause constipation. You can also help prevent constipation by eating foods high in fiber such as fruits and vegetables and drinking plenty of fluids as your diet allows.  After surgery, you can help prevent lung complications by doing breathing exercises.  Take deep breaths and cough every 1-2 hours. Your doctor may order a device called an Incentive Spirometer to help you take deep breaths. When coughing or sneezing, hold a pillow firmly against your incision with both hands. This is called "splinting." Doing this helps protect your incision. It also decreases belly discomfort.  If you are being discharged the day of  surgery, you will not be allowed to drive home. You will need a responsible adult (18 years or older) to drive you home and stay with you that night.   If you are taking public transportation, you will need to have a responsible adult (18 years or older) with you. Please confirm with your physician that it is acceptable to use public transportation.   Please call the Pre-admissions Testing Dept. at [phone] if you have  any questions about these instructions.  Visitation Policy:  Patients undergoing a surgery or procedure may have one family member or support person with them as long as that person is not COVID-19 positive or experiencing its symptoms.  That person may remain in the waiting area during the procedure.

## 2020-08-02 ENCOUNTER — Other Ambulatory Visit: Payer: BC Managed Care – PPO

## 2020-08-04 ENCOUNTER — Ambulatory Visit: Admission: RE | Admit: 2020-08-04 | Payer: BC Managed Care – PPO | Source: Home / Self Care | Admitting: Surgery

## 2020-08-04 ENCOUNTER — Encounter: Admission: RE | Payer: Self-pay | Source: Home / Self Care

## 2020-08-04 SURGERY — CHOLECYSTECTOMY, ROBOT-ASSISTED, LAPAROSCOPIC
Anesthesia: General | Site: Abdomen

## 2020-08-10 ENCOUNTER — Ambulatory Visit: Payer: BLUE CROSS/BLUE SHIELD | Admitting: Dermatology

## 2020-08-22 ENCOUNTER — Ambulatory Visit: Payer: Self-pay | Admitting: Surgery

## 2020-08-22 ENCOUNTER — Encounter
Admission: RE | Admit: 2020-08-22 | Discharge: 2020-08-22 | Disposition: A | Discharge status: Home / Self Care | Payer: BC Managed Care – PPO | Source: Ambulatory Visit | Attending: Surgery | Admitting: Surgery

## 2020-08-22 NOTE — Patient Instructions (Signed)
Your procedure is scheduled on: 08/25/20 Report to DAY SURGERY DEPARTMENT LOCATED ON 2ND FLOOR MEDICAL MALL ENTRANCE. To find out your arrival time please call [phone] between 1PM - 3PM on 08/24/20.  Remember: Instructions that are not followed completely may result in serious medical risk, up to and including death, or upon the discretion of your surgeon and anesthesiologist your surgery may need to be rescheduled.     _X__ 1. Do not eat food after midnight the night before your procedure.                                  . Diabetics water only up to 2 hours prior to arrival  __X__2.  On the morning of surgery brush your teeth with toothpaste and water, you                 may rinse your mouth with mouthwash if you wish.  Do not swallow any              toothpaste of mouthwash.     _X__ 3.  No Alcohol for 24 hours before or after surgery.   _X__ 4.  Do Not Smoke or use e-cigarettes For 24 Hours Prior to Your Surgery.                 Do not use any chewable tobacco products for at least 6 hours prior to                 surgery.  ____  5.  Bring all medications with you on the day of surgery if instructed.   __X__  6.  Notify your doctor if there is any change in your medical condition      (cold, fever, infections).     Do not wear jewelry, make-up, hairpins, clips or nail polish. Do not wear lotions, powders, or perfumes.  Do not shave 48 hours prior to surgery. Men may shave face and neck. Do not bring valuables to the hospital.    [facility] is not responsible for any belongings or valuables.  Contacts, dentures/partials or body piercings may not be worn into surgery. Bring a case for your contacts, glasses or hearing aids, a denture cup will be supplied. Leave your suitcase in the car. After surgery it may be brought to your room. For patients admitted to the hospital, discharge time is determined by your treatment team.   Patients discharged the day of surgery will not  be allowed to drive home.   Please read over the following fact sheets that you were given:   CHG soap  __X__ Take these medicines the morning of surgery with A SIP OF WATER:    1. none  2.   3.   4.  5.  6.  ____ Fleet Enema (as directed)   __X__ Use CHG Soap/SAGE wipes as directed  __X__ Use inhalers on the day of surgery  ____ Stop metformin/Janumet/Farxiga 2 days prior to surgery    ____ Take 1/2 of usual insulin dose the night before surgery. No insulin the morning          of surgery.   ____ Stop Blood Thinners Coumadin/Plavix/Xarelto/Pleta/Pradaxa/Eliquis/Effient/Aspirin  on   Or contact your Surgeon, Cardiologist or Medical Doctor regarding  ability to stop your blood thinners  __X__ Stop Anti-inflammatories 7 days before surgery such as Advil, Ibuprofen, Motrin,  BC or Goodies Powder,  Naprosyn, Naproxen, Aleve, Aspirin    __X__ Stop all herbal supplements, fish oil or vitamin E until after surgery.    ____ Bring C-Pap to the hospital.

## 2020-08-25 ENCOUNTER — Other Ambulatory Visit: Payer: Self-pay

## 2020-08-25 ENCOUNTER — Encounter: Payer: Self-pay | Admitting: Surgery

## 2020-08-25 ENCOUNTER — Ambulatory Visit: Payer: BC Managed Care – PPO | Admitting: Urgent Care

## 2020-08-25 ENCOUNTER — Ambulatory Visit
Admission: RE | Admit: 2020-08-25 | Discharge: 2020-08-25 | Disposition: A | Discharge status: Home / Self Care | Payer: BC Managed Care – PPO | Attending: Surgery | Admitting: Surgery

## 2020-08-25 ENCOUNTER — Encounter
Admission: RE | Disposition: A | Discharge status: Home / Self Care | Payer: Self-pay | Source: Home / Self Care | Attending: Surgery

## 2020-08-25 DIAGNOSIS — Z888 Allergy status to other drugs, medicaments and biological substances status: Secondary | ICD-10-CM | POA: Diagnosis not present

## 2020-08-25 DIAGNOSIS — E119 Type 2 diabetes mellitus without complications: Secondary | ICD-10-CM | POA: Diagnosis not present

## 2020-08-25 DIAGNOSIS — I10 Essential (primary) hypertension: Secondary | ICD-10-CM | POA: Diagnosis not present

## 2020-08-25 DIAGNOSIS — Z87891 Personal history of nicotine dependence: Secondary | ICD-10-CM | POA: Insufficient documentation

## 2020-08-25 DIAGNOSIS — K811 Chronic cholecystitis: Secondary | ICD-10-CM

## 2020-08-25 DIAGNOSIS — K828 Other specified diseases of gallbladder: Secondary | ICD-10-CM | POA: Insufficient documentation

## 2020-08-25 DIAGNOSIS — K824 Cholesterolosis of gallbladder: Secondary | ICD-10-CM | POA: Diagnosis not present

## 2020-08-25 DIAGNOSIS — K8010 Calculus of gallbladder with chronic cholecystitis without obstruction: Secondary | ICD-10-CM | POA: Diagnosis not present

## 2020-08-25 DIAGNOSIS — E785 Hyperlipidemia, unspecified: Secondary | ICD-10-CM | POA: Diagnosis not present

## 2020-08-25 DIAGNOSIS — Z79899 Other long term (current) drug therapy: Secondary | ICD-10-CM | POA: Diagnosis not present

## 2020-08-25 DIAGNOSIS — K219 Gastro-esophageal reflux disease without esophagitis: Secondary | ICD-10-CM | POA: Diagnosis not present

## 2020-08-25 DIAGNOSIS — Z886 Allergy status to analgesic agent status: Secondary | ICD-10-CM | POA: Insufficient documentation

## 2020-08-25 DIAGNOSIS — Z7984 Long term (current) use of oral hypoglycemic drugs: Secondary | ICD-10-CM | POA: Insufficient documentation

## 2020-08-25 DIAGNOSIS — K8020 Calculus of gallbladder without cholecystitis without obstruction: Secondary | ICD-10-CM | POA: Diagnosis not present

## 2020-08-25 DIAGNOSIS — E1169 Type 2 diabetes mellitus with other specified complication: Secondary | ICD-10-CM | POA: Diagnosis not present

## 2020-08-25 LAB — CBC
HCT: 46.5 % (ref 39.0–52.0)
Hemoglobin: 15.7 g/dL (ref 13.0–17.0)
MCH: 29.5 pg (ref 26.0–34.0)
MCHC: 33.8 g/dL (ref 30.0–36.0)
MCV: 87.2 fL (ref 80.0–100.0)
Platelets: 214 10*3/uL (ref 150–400)
RBC: 5.33 MIL/uL (ref 4.22–5.81)
RDW: 13.9 % (ref 11.5–15.5)
WBC: 4.3 10*3/uL (ref 4.0–10.5)
nRBC: 0 % (ref 0.0–0.2)

## 2020-08-25 LAB — BASIC METABOLIC PANEL
Anion gap: 6 (ref 5–15)
BUN: 17 mg/dL (ref 6–20)
CO2: 25 mmol/L (ref 22–32)
Calcium: 8.6 mg/dL — ABNORMAL LOW (ref 8.9–10.3)
Chloride: 105 mmol/L (ref 98–111)
Creatinine, Ser: 0.9 mg/dL (ref 0.61–1.24)
GFR, Estimated: >60 mL/min (ref >60)
Glucose, Bld: 214 mg/dL — ABNORMAL HIGH (ref 70–99)
Potassium: 4.2 mmol/L (ref 3.5–5.1)
Sodium: 136 mmol/L (ref 135–145)

## 2020-08-25 LAB — GLUCOSE, CAPILLARY
Glucose-Capillary: 199 mg/dL — ABNORMAL HIGH (ref 70–99)
Glucose-Capillary: 205 mg/dL — ABNORMAL HIGH (ref 70–99)

## 2020-08-25 SURGERY — CHOLECYSTECTOMY, ROBOT-ASSISTED, LAPAROSCOPIC
Anesthesia: General | Site: Abdomen

## 2020-08-25 MED ORDER — FAMOTIDINE 20 MG PO TABS
ORAL_TABLET | ORAL | Status: AC
  Administered 2020-08-25: 20 mg via ORAL
  Filled 2020-08-25: qty 1

## 2020-08-25 MED ORDER — HYDROCODONE-ACETAMINOPHEN 5-325 MG PO TABS: 1.0000 | ORAL_TABLET | Freq: Four times a day (QID) | ORAL | 0 refills | Status: DC | PRN

## 2020-08-25 MED ORDER — LIDOCAINE HCL (CARDIAC) PF 100 MG/5ML IV SOSY
PREFILLED_SYRINGE | INTRAVENOUS | Status: DC | PRN
  Administered 2020-08-25: 100 mg via INTRAVENOUS

## 2020-08-25 MED ORDER — ORAL CARE MOUTH RINSE: 15.0000 mL | Freq: Once | OROMUCOSAL | Status: AC

## 2020-08-25 MED ORDER — GABAPENTIN 300 MG PO CAPS
ORAL_CAPSULE | ORAL | Status: AC
  Administered 2020-08-25: 300 mg via ORAL
  Filled 2020-08-25: qty 1

## 2020-08-25 MED ORDER — DEXAMETHASONE SODIUM PHOSPHATE 10 MG/ML IJ SOLN
INTRAMUSCULAR | Status: AC
  Filled 2020-08-25: qty 4

## 2020-08-25 MED ORDER — ONDANSETRON HCL 4 MG/2ML IJ SOLN
INTRAMUSCULAR | Status: AC
  Filled 2020-08-25: qty 12

## 2020-08-25 MED ORDER — SUGAMMADEX SODIUM 500 MG/5ML IV SOLN
INTRAVENOUS | Status: AC
  Filled 2020-08-25: qty 15

## 2020-08-25 MED ORDER — PROPOFOL 10 MG/ML IV BOLUS
INTRAVENOUS | Status: DC | PRN
  Administered 2020-08-25: 180 mg via INTRAVENOUS

## 2020-08-25 MED ORDER — SODIUM CHLORIDE 0.9 % IV SOLN: INTRAVENOUS | Status: DC

## 2020-08-25 MED ORDER — CHLORHEXIDINE GLUCONATE 0.12 % MT SOLN: 15.0000 mL | Freq: Once | OROMUCOSAL | Status: AC

## 2020-08-25 MED ORDER — ACETAMINOPHEN 500 MG PO TABS: 1000.0000 mg | ORAL_TABLET | ORAL | Status: AC

## 2020-08-25 MED ORDER — DEXMEDETOMIDINE (PRECEDEX) IN NS 20 MCG/5ML (4 MCG/ML) IV SYRINGE
PREFILLED_SYRINGE | INTRAVENOUS | Status: AC
  Filled 2020-08-25: qty 10

## 2020-08-25 MED ORDER — GLYCOPYRROLATE 0.2 MG/ML IJ SOLN
INTRAMUSCULAR | Status: AC
  Filled 2020-08-25: qty 4

## 2020-08-25 MED ORDER — DOCUSATE SODIUM 100 MG PO CAPS: 100.0000 mg | ORAL_CAPSULE | Freq: Two times a day (BID) | ORAL | 0 refills | Status: AC | PRN

## 2020-08-25 MED ORDER — ROCURONIUM BROMIDE 100 MG/10ML IV SOLN
INTRAVENOUS | Status: DC | PRN
  Administered 2020-08-25: 60 mg via INTRAVENOUS
  Administered 2020-08-25 (×2): 20 mg via INTRAVENOUS

## 2020-08-25 MED ORDER — DEXMEDETOMIDINE (PRECEDEX) IN NS 20 MCG/5ML (4 MCG/ML) IV SYRINGE
PREFILLED_SYRINGE | INTRAVENOUS | Status: DC | PRN
  Administered 2020-08-25 (×2): 8 ug via INTRAVENOUS

## 2020-08-25 MED ORDER — FENTANYL CITRATE (PF) 100 MCG/2ML IJ SOLN
INTRAMUSCULAR | Status: DC | PRN
  Administered 2020-08-25 (×2): 50 ug via INTRAVENOUS

## 2020-08-25 MED ORDER — ACETAMINOPHEN 325 MG PO TABS: 650.0000 mg | ORAL_TABLET | Freq: Three times a day (TID) | ORAL | 0 refills | Status: DC | PRN

## 2020-08-25 MED ORDER — ESMOLOL HCL 100 MG/10ML IV SOLN
INTRAVENOUS | Status: AC
  Filled 2020-08-25: qty 10

## 2020-08-25 MED ORDER — EPHEDRINE SULFATE 50 MG/ML IJ SOLN
INTRAMUSCULAR | Status: DC | PRN
  Administered 2020-08-25: 10 mg via INTRAVENOUS
  Administered 2020-08-25: 5 mg via INTRAVENOUS

## 2020-08-25 MED ORDER — FENTANYL CITRATE (PF) 100 MCG/2ML IJ SOLN
INTRAMUSCULAR | Status: AC
  Filled 2020-08-25: qty 2

## 2020-08-25 MED ORDER — ROCURONIUM BROMIDE 10 MG/ML (PF) SYRINGE
PREFILLED_SYRINGE | INTRAVENOUS | Status: AC
  Filled 2020-08-25: qty 30

## 2020-08-25 MED ORDER — HYDROMORPHONE HCL 1 MG/ML IJ SOLN
INTRAMUSCULAR | Status: AC
  Filled 2020-08-25: qty 1

## 2020-08-25 MED ORDER — ACETAMINOPHEN 500 MG PO TABS
ORAL_TABLET | ORAL | Status: AC
  Administered 2020-08-25: 1000 mg via ORAL
  Filled 2020-08-25: qty 2

## 2020-08-25 MED ORDER — CHLORHEXIDINE GLUCONATE 0.12 % MT SOLN
OROMUCOSAL | Status: AC
  Administered 2020-08-25: 15 mL via OROMUCOSAL
  Filled 2020-08-25: qty 15

## 2020-08-25 MED ORDER — DEXAMETHASONE SODIUM PHOSPHATE 10 MG/ML IJ SOLN
INTRAMUSCULAR | Status: DC | PRN
  Administered 2020-08-25: 5 mg via INTRAVENOUS

## 2020-08-25 MED ORDER — CHLORHEXIDINE GLUCONATE CLOTH 2 % EX PADS: 6.0000 | MEDICATED_PAD | Freq: Once | CUTANEOUS | Status: DC

## 2020-08-25 MED ORDER — GABAPENTIN 300 MG PO CAPS: 300.0000 mg | ORAL_CAPSULE | ORAL | Status: AC

## 2020-08-25 MED ORDER — EPHEDRINE 5 MG/ML INJ
INTRAVENOUS | Status: AC
  Filled 2020-08-25: qty 10

## 2020-08-25 MED ORDER — INDOCYANINE GREEN 25 MG IV SOLR
1.2500 mg | Freq: Once | INTRAVENOUS | Status: AC
  Administered 2020-08-25: 1.25 mg via INTRAVENOUS
  Filled 2020-08-25: qty 10
  Filled 2020-08-25: qty 0.5

## 2020-08-25 MED ORDER — HYDROMORPHONE HCL 1 MG/ML IJ SOLN
INTRAMUSCULAR | Status: DC | PRN
  Administered 2020-08-25: .5 mg via INTRAVENOUS

## 2020-08-25 MED ORDER — LIDOCAINE-EPINEPHRINE (PF) 1 %-1:200000 IJ SOLN
INTRAMUSCULAR | Status: DC | PRN
  Administered 2020-08-25: 8 mL

## 2020-08-25 MED ORDER — OXYCODONE HCL 5 MG/5ML PO SOLN: 5.0000 mg | Freq: Once | ORAL | Status: AC | PRN

## 2020-08-25 MED ORDER — MEPERIDINE HCL 50 MG/ML IJ SOLN: 6.2500 mg | INTRAMUSCULAR | Status: DC | PRN

## 2020-08-25 MED ORDER — PROMETHAZINE HCL 25 MG/ML IJ SOLN: 6.2500 mg | INTRAMUSCULAR | Status: DC | PRN

## 2020-08-25 MED ORDER — SUCCINYLCHOLINE CHLORIDE 200 MG/10ML IV SOSY
PREFILLED_SYRINGE | INTRAVENOUS | Status: AC
  Filled 2020-08-25: qty 10

## 2020-08-25 MED ORDER — OXYCODONE HCL 5 MG PO TABS
5.0000 mg | ORAL_TABLET | Freq: Once | ORAL | Status: AC | PRN
  Administered 2020-08-25: 5 mg via ORAL

## 2020-08-25 MED ORDER — BUPIVACAINE HCL (PF) 0.5 % IJ SOLN
INTRAMUSCULAR | Status: AC
  Filled 2020-08-25: qty 30

## 2020-08-25 MED ORDER — BUPIVACAINE HCL (PF) 0.5 % IJ SOLN
INTRAMUSCULAR | Status: DC | PRN
  Administered 2020-08-25: 8 mL

## 2020-08-25 MED ORDER — OXYCODONE HCL 5 MG PO TABS
ORAL_TABLET | ORAL | Status: AC
  Filled 2020-08-25: qty 1

## 2020-08-25 MED ORDER — CEFAZOLIN SODIUM-DEXTROSE 2-4 GM/100ML-% IV SOLN
INTRAVENOUS | Status: AC
  Filled 2020-08-25: qty 100

## 2020-08-25 MED ORDER — FENTANYL CITRATE (PF) 100 MCG/2ML IJ SOLN
INTRAMUSCULAR | Status: AC
  Administered 2020-08-25: 50 ug via INTRAVENOUS
  Filled 2020-08-25: qty 2

## 2020-08-25 MED ORDER — FAMOTIDINE 20 MG PO TABS: 20.0000 mg | ORAL_TABLET | Freq: Once | ORAL | Status: AC

## 2020-08-25 MED ORDER — PHENYLEPHRINE HCL (PRESSORS) 10 MG/ML IV SOLN
INTRAVENOUS | Status: AC
  Filled 2020-08-25: qty 1

## 2020-08-25 MED ORDER — FENTANYL CITRATE (PF) 100 MCG/2ML IJ SOLN
25.0000 ug | INTRAMUSCULAR | Status: DC | PRN
  Administered 2020-08-25 (×2): 50 ug via INTRAVENOUS

## 2020-08-25 MED ORDER — LIDOCAINE HCL (PF) 2 % IJ SOLN
INTRAMUSCULAR | Status: AC
  Filled 2020-08-25: qty 15

## 2020-08-25 MED ORDER — GLYCOPYRROLATE 0.2 MG/ML IJ SOLN
INTRAMUSCULAR | Status: DC | PRN
  Administered 2020-08-25: .2 mg via INTRAVENOUS

## 2020-08-25 MED ORDER — SODIUM CHLORIDE 0.9 % IV SOLN
INTRAVENOUS | Status: DC | PRN
  Administered 2020-08-25: 20 ug/min via INTRAVENOUS

## 2020-08-25 MED ORDER — CEFAZOLIN SODIUM-DEXTROSE 2-4 GM/100ML-% IV SOLN
2.0000 g | INTRAVENOUS | Status: AC
  Administered 2020-08-25: 3 g via INTRAVENOUS

## 2020-08-25 MED ORDER — PHENYLEPHRINE HCL (PRESSORS) 10 MG/ML IV SOLN
INTRAVENOUS | Status: DC | PRN
  Administered 2020-08-25 (×2): 200 ug via INTRAVENOUS

## 2020-08-25 MED ORDER — LIDOCAINE-EPINEPHRINE (PF) 1 %-1:200000 IJ SOLN
INTRAMUSCULAR | Status: AC
  Filled 2020-08-25: qty 30

## 2020-08-25 MED ORDER — METOPROLOL TARTRATE 5 MG/5ML IV SOLN
INTRAVENOUS | Status: AC
  Filled 2020-08-25: qty 5

## 2020-08-25 MED ORDER — MIDAZOLAM HCL 2 MG/2ML IJ SOLN
INTRAMUSCULAR | Status: DC | PRN
  Administered 2020-08-25: 2 mg via INTRAVENOUS

## 2020-08-25 MED ORDER — ONDANSETRON HCL 4 MG/2ML IJ SOLN
INTRAMUSCULAR | Status: DC | PRN
  Administered 2020-08-25 (×2): 4 mg via INTRAVENOUS

## 2020-08-25 SURGICAL SUPPLY — 57 items
ADH SKN CLS APL DERMABOND .7 (GAUZE/BANDAGES/DRESSINGS) ×1
ANCHOR TIS RET SYS 235ML (MISCELLANEOUS) ×2 IMPLANT
APL PRP STRL LF DISP 70% ISPRP (MISCELLANEOUS) ×1
BAG INFUSER PRESSURE 100CC (MISCELLANEOUS) ×2 IMPLANT
BAG TISS RTRVL C235 10X14 (MISCELLANEOUS) ×1
BLADE SURG SZ11 CARB STEEL (BLADE) ×2 IMPLANT
CANISTER SUCT 1200ML W/VALVE (MISCELLANEOUS) IMPLANT
CANNULA REDUC XI 12-8 STAPL (CANNULA) ×1
CANNULA REDUCER 12-8 DVNC XI (CANNULA) ×1 IMPLANT
CATH REDDICK CHOLANGI 4FR 50CM (CATHETERS) IMPLANT
CHLORAPREP W/TINT 26 (MISCELLANEOUS) ×2 IMPLANT
CLIP VESOLOCK MED LG 6/CT (CLIP) ×4 IMPLANT
COVER TIP SHEARS 8 DVNC (MISCELLANEOUS) ×1 IMPLANT
COVER TIP SHEARS 8MM DA VINCI (MISCELLANEOUS) ×1
COVER WAND RF STERILE (DRAPES) IMPLANT
DECANTER SPIKE VIAL GLASS SM (MISCELLANEOUS) IMPLANT
DEFOGGER SCOPE WARMER CLEARIFY (MISCELLANEOUS) ×2 IMPLANT
DERMABOND ADVANCED (GAUZE/BANDAGES/DRESSINGS) ×1
DERMABOND ADVANCED .7 DNX12 (GAUZE/BANDAGES/DRESSINGS) ×1 IMPLANT
DRAPE ARM DVNC X/XI (DISPOSABLE) ×4 IMPLANT
DRAPE C-ARM XRAY 36X54 (DRAPES) IMPLANT
DRAPE COLUMN DVNC XI (DISPOSABLE) ×1 IMPLANT
DRAPE DA VINCI XI ARM (DISPOSABLE) ×4
DRAPE DA VINCI XI COLUMN (DISPOSABLE) ×1
ELECT CAUTERY BLADE 6.4 (BLADE) ×2 IMPLANT
ELECT REM PT RETURN 9FT ADLT (ELECTROSURGICAL) ×2
ELECTRODE REM PT RTRN 9FT ADLT (ELECTROSURGICAL) ×1 IMPLANT
GLOVE SURG SYN 6.5 ES PF (GLOVE) ×4 IMPLANT
GLOVE SURG UNDER POLY LF SZ7 (GLOVE) ×4 IMPLANT
GOWN STRL REUS W/ TWL LRG LVL3 (GOWN DISPOSABLE) ×3 IMPLANT
GOWN STRL REUS W/TWL LRG LVL3 (GOWN DISPOSABLE) ×6
GRASPER SUT TROCAR 14GX15 (MISCELLANEOUS) IMPLANT
IRRIGATOR SUCT 8 DISP DVNC XI (IRRIGATION / IRRIGATOR) ×1 IMPLANT
IRRIGATOR SUCTION 8MM XI DISP (IRRIGATION / IRRIGATOR) ×1
IV NS 1000ML (IV SOLUTION) ×2
IV NS 1000ML BAXH (IV SOLUTION) ×1 IMPLANT
LABEL OR SOLS (LABEL) ×2 IMPLANT
MANIFOLD NEPTUNE II (INSTRUMENTS) ×2 IMPLANT
NEEDLE HYPO 22GX1.5 SAFETY (NEEDLE) ×2 IMPLANT
NEEDLE INSUFFLATION 14GA 120MM (NEEDLE) ×2 IMPLANT
NS IRRIG 500ML POUR BTL (IV SOLUTION) ×2 IMPLANT
OBTURATOR OPTICAL STANDARD 8MM (TROCAR) ×1
OBTURATOR OPTICAL STND 8 DVNC (TROCAR) ×1
OBTURATOR OPTICALSTD 8 DVNC (TROCAR) ×1 IMPLANT
PACK LAP CHOLECYSTECTOMY (MISCELLANEOUS) ×2 IMPLANT
PENCIL ELECTRO HAND CTR (MISCELLANEOUS) ×2 IMPLANT
SEAL CANN UNIV 5-8 DVNC XI (MISCELLANEOUS) ×3 IMPLANT
SEAL XI 5MM-8MM UNIVERSAL (MISCELLANEOUS) ×3
SET TUBE SMOKE EVAC HIGH FLOW (TUBING) ×2 IMPLANT
SOLUTION ELECTROLUBE (MISCELLANEOUS) ×2 IMPLANT
STAPLER CANNULA SEAL DVNC XI (STAPLE) ×1 IMPLANT
STAPLER CANNULA SEAL XI (STAPLE) ×1
SUT MNCRL 4-0 (SUTURE) ×4
SUT MNCRL 4-0 27XMFL (SUTURE) ×2
SUT VICRYL 0 AB UR-6 (SUTURE) ×2 IMPLANT
SUTURE MNCRL 4-0 27XMF (SUTURE) ×2 IMPLANT
SYR 30ML LL (SYRINGE) IMPLANT

## 2020-08-25 NOTE — Interval H&P Note (Signed)
History and Physical Interval Note:  08/25/2020 8:08 AM  Dean Valentine  has presented today for surgery, with the diagnosis of K80.20 calcus of gallbladder w/o cholecystitis or obstruction.  The various methods of treatment have been discussed with the patient and family. After consideration of risks, benefits and other options for treatment, the patient has consented to  Procedure(s): XI ROBOTIC ASSISTED LAPAROSCOPIC CHOLECYSTECTOMY (N/A) as a surgical intervention.  The patient's history has been reviewed, patient examined, no change in status, stable for surgery.  I have reviewed the patient's chart and labs.  Questions were answered to the patient's satisfaction.  STILL NO GUARANTEE OF SYMPTOM RESOLUTION.  PT UNDERSTANDS   Dean Valentine Dean Valentine

## 2020-08-25 NOTE — Discharge Instructions (Addendum)
Laparoscopic Cholecystectomy, Care After This sheet gives you information about how to care for yourself after your procedure. Your doctor may also give you more specific instructions. If you have problems or questions, contact your doctor. Follow these instructions at home: Care for cuts from surgery (incisions)   Follow instructions from your doctor about how to take care of your cuts from surgery. Make sure you: ? Wash your hands with soap and water before you change your bandage (dressing). If you cannot use soap and water, use hand sanitizer. ? Change your bandage as told by your doctor. ? Leave stitches (sutures), skin glue, or skin tape (adhesive) strips in place. They may need to stay in place for 2 weeks or longer. If tape strips get loose and curl up, you may trim the loose edges. Do not remove tape strips completely unless your doctor says it is okay.  Do not take baths, swim, or use a hot tub until your doctor says it is okay. OK TO SHOWER 24HRS AFTER YOUR SURGERY.   Check your surgical cut area every day for signs of infection. Check for: ? More redness, swelling, or pain. ? More fluid or blood. ? Warmth. ? Pus or a bad smell. Activity  Do not drive or use heavy machinery while taking prescription pain medicine.  Do not play contact sports until your doctor says it is okay.  Do not drive for 24 hours if you were given a medicine to help you relax (sedative).  Rest as needed. Do not return to work or school until your doctor says it is okay. General instructions   tylenol and advil as needed for discomfort.  Please alternate between the two every four hours as needed for pain.     Use narcotics, if prescribed, only when tylenol and motrin is not enough to control pain.   325-650mg  every 8hrs to max of 3000mg /24hrs (including the 325mg  in every norco dose) for the tylenol.     Advil up to 800mg  per dose every 8hrs as needed for pain.    To prevent or treat constipation  while you are taking prescription pain medicine, your doctor may recommend that you: ? Drink enough fluid to keep your pee (urine) clear or pale yellow. ? Take over-the-counter or prescription medicines. ? Eat foods that are high in fiber, such as fresh fruits and vegetables, whole grains, and beans. ? Limit foods that are high in fat and processed sugars, such as fried and sweet foods. Contact a doctor if:  You develop a rash.  You have more redness, swelling, or pain around your surgical cuts.  You have more fluid or blood coming from your surgical cuts.  Your surgical cuts feel warm to the touch.  You have pus or a bad smell coming from your surgical cuts.  You have a fever.  One or more of your surgical cuts breaks open. Get help right away if:  You have trouble breathing.  You have chest pain.  You have pain that is getting worse in your shoulders.  You faint or feel dizzy when you stand.  You have very bad pain in your belly (abdomen).  You are sick to your stomach (nauseous) for more than one day.  You have throwing up (vomiting) that lasts for more than one day.  You have leg pain. This information is not intended to replace advice given to you by your health care provider. Make sure you discuss any questions you have with your  health care provider. Document Released: 03/26/2008 Document Revised: 01/06/2016 Document Reviewed: 12/04/2015 Elsevier Interactive Patient Education  2019 [employer]  AMBULATORY SURGERY  DISCHARGE INSTRUCTIONS   1) The drugs that you were given will stay in your system until tomorrow so for the next 24 hours you should not:  A) Drive an automobile B) Make any legal decisions C) Drink any alcoholic beverage   2) You may resume regular meals tomorrow.  Today it is better to start with liquids and gradually work up to solid foods.  You may eat anything you prefer, but it is better to start with liquids, then soup and crackers,  and gradually work up to solid foods.   3) Please notify your doctor immediately if you have any unusual bleeding, trouble breathing, redness and pain at the surgery site, drainage, fever, or pain not relieved by medication.    4) Additional Instructions:        Please contact your physician with any problems or Same Day Surgery at [phone], Monday through Friday 6 am to 4 pm, or [place] [facility] number at [phone].    Gallbladder Eating Plan If you have a gallbladder condition, you may have trouble digesting fats. Eating a low-fat diet can help reduce your symptoms, and may be helpful before and after having surgery to remove your gallbladder (cholecystectomy). Your health care provider may recommend that you work with a diet and nutrition specialist (dietitian) to help you reduce the amount of fat in your diet. What are tips for following this plan? General guidelines  Limit your fat intake to less than 30% of your total daily calories. If you eat around 1,800 calories each day, this is less than 60 grams (g) of fat per day.  Fat is an important part of a healthy diet. Eating a low-fat diet can make it hard to maintain a healthy body weight. Ask your dietitian how much fat, calories, and other nutrients you need each day.  Eat small, frequent meals throughout the day instead of three large meals.  Drink at least 8-10 cups of fluid a day. Drink enough fluid to keep your urine clear or pale yellow.  Limit alcohol intake to no more than 1 drink a day for nonpregnant women and 2 drinks a day for men. One drink equals 12 oz of beer, 5 oz of wine, or 1 oz of hard liquor. Reading food labels  Check Nutrition Facts on food labels for the amount of fat per serving. Choose foods with less than 3 grams of fat per serving.   Shopping  Choose nonfat and low-fat healthy foods. Look for the words "nonfat," "low fat," or "fat free."  Avoid buying processed or  prepackaged foods. Cooking  Cook using low-fat methods, such as baking, broiling, grilling, or boiling.  Cook with small amounts of healthy fats, such as olive oil, grapeseed oil, canola oil, or sunflower oil. What foods are recommended?  All fresh, frozen, or canned fruits and vegetables.  Whole grains.  Low-fat or non-fat (skim) milk and yogurt.  Lean meat, skinless poultry, fish, eggs, and beans.  Low-fat protein supplement powders or drinks.  Spices and herbs. What foods are not recommended?  High-fat foods. These include baked goods, fast food, fatty cuts of meat, ice cream, french toast, sweet rolls, pizza, cheese bread, foods covered with butter, creamy sauces, or cheese.  Fried foods. These include french fries, tempura, battered fish, breaded chicken, fried breads, and sweets.  Foods with strong  odors.  Foods that cause bloating and gas. Summary  A low-fat diet can be helpful if you have a gallbladder condition, or before and after gallbladder surgery.  Limit your fat intake to less than 30% of your total daily calories. This is about 60 g of fat if you eat 1,800 calories each day.  Eat small, frequent meals throughout the day instead of three large meals. This information is not intended to replace advice given to you by your health care provider. Make sure you discuss any questions you have with your health care provider. Document Revised: 02/03/2020 Document Reviewed: 02/03/2020 Elsevier Patient Education  2021 [employer].

## 2020-08-25 NOTE — Anesthesia Procedure Notes (Signed)
Procedure Name: Intubation Performed by: Fletcher-Harrison, Kartik Fernando, CRNA Pre-anesthesia Checklist: Patient identified, Emergency Drugs available, Suction available and Patient being monitored Patient Re-evaluated:Patient Re-evaluated prior to induction Oxygen Delivery Method: Circle system utilized Preoxygenation: Pre-oxygenation with 100% oxygen Induction Type: IV induction Ventilation: Mask ventilation without difficulty Laryngoscope Size: McGraph and 4 Grade View: Grade I Tube type: Oral Tube size: 7.5 mm Number of attempts: 1 Airway Equipment and Method: Stylet and Oral airway Placement Confirmation: ETT inserted through vocal cords under direct vision,  positive ETCO2,  breath sounds checked- equal and bilateral and CO2 detector Secured at: 22 cm Tube secured with: Tape Dental Injury: Teeth and Oropharynx as per pre-operative assessment        

## 2020-08-25 NOTE — Anesthesia Preprocedure Evaluation (Signed)
Anesthesia Evaluation  Patient identified by MRN, date of birth, ID band Patient awake    Reviewed: Allergy & Precautions, NPO status , Patient's Chart, lab work & pertinent test results  History of Anesthesia Complications Negative for: history of anesthetic complications  Airway Mallampati: III  TM Distance: >3 FB Neck ROM: Full    Dental  (+) Poor Dentition   Pulmonary asthma (exercise induced) ,    breath sounds clear to auscultation- rhonchi (-) wheezing      Cardiovascular hypertension, (-) CAD, (-) Past MI, (-) Cardiac Stents and (-) CABG  Rhythm:Regular Rate:Normal - Systolic murmurs and - Diastolic murmurs    Neuro/Psych  Headaches, negative psych ROS   GI/Hepatic Neg liver ROS, PUD,   Endo/Other  diabetes, Oral Hypoglycemic Agents  Renal/GU negative Renal ROS     Musculoskeletal negative musculoskeletal ROS (+)   Abdominal (+) + obese,   Peds  Hematology  (+) anemia ,   Anesthesia Other Findings Past Medical History: No date: Acute posthemorrhagic anemia No date: Allergic rhinitis No date: Diabetes mellitus without complication (HCC) No date: Diverticulosis of colon without hemorrhage No date: Elevated blood pressure No date: Headache No date: Hyperglycemia No date: Internal hemorrhoid   Reproductive/Obstetrics                             Anesthesia Physical Anesthesia Plan  ASA: III  Anesthesia Plan: General   Post-op Pain Management:    Induction: Intravenous  PONV Risk Score and Plan: 1 and Ondansetron and Midazolam  Airway Management Planned: Oral ETT  Additional Equipment:   Intra-op Plan:   Post-operative Plan: Extubation in OR  Informed Consent: I have reviewed the patients History and Physical, chart, labs and discussed the procedure including the risks, benefits and alternatives for the proposed anesthesia with the patient or authorized representative  who has indicated his/her understanding and acceptance.     Dental advisory given  Plan Discussed with: CRNA and Anesthesiologist  Anesthesia Plan Comments:         Anesthesia Quick Evaluation

## 2020-08-25 NOTE — Transfer of Care (Signed)
Immediate Anesthesia Transfer of Care Note  Patient: Dean Valentine  Procedure(s) Performed: XI ROBOTIC ASSISTED LAPAROSCOPIC CHOLECYSTECTOMY (N/A Abdomen)  Patient Location: PACU  Anesthesia Type:General  Level of Consciousness: awake, drowsy and patient cooperative  Airway & Oxygen Therapy: Patient Spontanous Breathing and Patient connected to face mask oxygen  Post-op Assessment: Report given to RN and Post -op Vital signs reviewed and stable  Post vital signs: Reviewed and stable  Last Vitals:  Vitals Value Taken Time  BP 126/62 08/25/20 1220  Temp 37 C 08/25/20 1221  Pulse 86 08/25/20 1230  Resp 14 08/25/20 1230  SpO2 93 % 08/25/20 1230  Vitals shown include unvalidated device data.  Last Pain:  Vitals:   08/25/20 0751  TempSrc: Temporal  PainSc: 0-No pain         Complications: No complications documented.

## 2020-08-27 NOTE — Op Note (Addendum)
Preoperative diagnosis:  cholelithiasis  Postoperative diagnosis: same as above  Procedure: Robotic assisted Laparoscopic Cholecystectomy.   Anesthesia: GETA   Surgeon: Sung Amabile  Specimen: Gallbladder  Complications: None  EBL: 56mL  Wound Classification: Clean Contaminated  Indications: see HPI  Findings: Critical view of safety noted Cystic duct and artery identified, ligated and divided, clips remained intact at end of procedure Adequate hemostasis  Description of procedure:  The patient was placed on the operating table in the supine position. SCDs placed, pre-op abx administered.  General anesthesia was induced and OG tube placed by anesthesia. A time-out was completed verifying correct patient, procedure, site, positioning, and implant(s) and/or special equipment prior to beginning this procedure. The abdomen was prepped and draped in the usual sterile fashion.    Veress needle was placed at the Palmer's point and insufflation was started after confirming a positive saline drop test and no immediate increase in abdominal pressure.  After reaching 15 mm, the Veress needle was removed and a 8 mm port was placed via optiview technique under umbilicus measured 26mm from gallbladder.  The abdomen was inspected and no abnormalities or injuries were found.  Under direct vision, ports were placed in the following locations: One 12 mm patient left of the umbilicus, 8cm from the optiviewed port, one 8 mm port placed to the patient right of the umbilical port 8 cm apart.  1 additional 8 mm port placed lateral to the 66mm port.  Once ports were placed, The table was placed in the reverse Trendelenburg position with the right side up. The Xi platform was brought into the operative field and docked to the ports successfully.  An endoscope was placed through the umbilical port, fenestrated grasper through the adjacent patient right port, prograsp to the far patient left port, and then a hook  cautery in the left port.  The dome of the gallbladder was grasped with prograsp, passed and retracted over the dome of the liver. Adhesions between the gallbladder and omentum, duodenum and transverse colon were lysed via hook cautery. The infundibulum was grasped with the fenestrated grasper and retracted toward the right lower quadrant. This maneuver exposed Calot's triangle. The peritoneum overlying the gallbladder infundibulum was then dissected using combination of [place] dissector and electrocautery hook and the cystic duct and cystic artery identified.  Critical view of safety with the liver bed clearly visible behind the duct and artery with no additional structures noted.  The cystic duct and cystic artery clipped and divided close to the gallbladder.     The gallbladder was then dissected from its peritoneal and liver bed attachments by electrocautery. Hemostasis was checked prior to removing the hook cautery and the Endo Catch bag was then placed through the 12 mm port and the gallbladder was removed.  The gallbladder was passed off the table as a specimen. There was no evidence of bleeding from the gallbladder fossa or cystic artery or leakage of the bile from the cystic duct stump. The 12 mm port site closed with PMI using 0 vicryl under direct vision.  Abdomen desufflated and secondary trocars were removed under direct vision. No bleeding was noted. All skin incisions then closed with subcuticular sutures of 4-0 monocryl and dressed with topical skin adhesive. The orogastric tube was removed and patient extubated.  The patient tolerated the procedure well and was taken to the postanesthesia care unit in stable condition.  All sponge and instrument count correct at end of procedure.

## 2020-08-27 NOTE — Anesthesia Postprocedure Evaluation (Signed)
Anesthesia Post Note  Patient: Dean Valentine  Procedure(s) Performed: XI ROBOTIC ASSISTED LAPAROSCOPIC CHOLECYSTECTOMY (N/A Abdomen)  Patient location during evaluation: PACU Anesthesia Type: General Level of consciousness: awake and alert and oriented Pain management: pain level controlled Vital Signs Assessment: post-procedure vital signs reviewed and stable Respiratory status: spontaneous breathing Cardiovascular status: blood pressure returned to baseline Anesthetic complications: no   No complications documented.   Last Vitals:  Vitals:   08/25/20 1330 08/25/20 1359  BP: 113/71 135/87  Pulse: 78 77  Resp: 11 16  Temp: 37 C (!) 36.1 C  SpO2: 90% 97%    Last Pain:  Vitals:   08/25/20 1359  TempSrc: Temporal  PainSc: 4                  CARROLL,PAUL

## 2020-08-28 LAB — SURGICAL PATHOLOGY

## 2020-09-04 NOTE — Progress Notes (Signed)
Established patient visit   Patient: Dean Valentine   DOB: September 10, 1960   60 y.o. Male  MRN: [id number] Visit Date: 09/05/2020  Today's healthcare provider: Shirlee Latch, MD   Chief Complaint  Patient presents with  . Diabetes   Subjective    HPI  Diabetes Mellitus Type II, Follow-up  Lab Results  Component Value Date   HGBA1C 8.0 (A) 09/05/2020   HGBA1C 7.3 (A) 06/05/2020   HGBA1C 7.1 (A) 11/03/2019   Wt Readings from Last 3 Encounters:  09/05/20 281 lb 11.2 oz (127.8 kg)  08/25/20 275 lb (124.7 kg)  08/22/20 275 lb (124.7 kg)   Last seen for diabetes 3 months ago.  Management since then includes add Farxiga 5mg  daily. He reports excellent compliance with treatment. He is not having side effects.   Symptoms: No fatigue No foot ulcerations  No appetite changes No nausea  No paresthesia of the feet  No polydipsia  No polyuria No visual disturbances   No vomiting     Home blood sugar records: fasting range: 150's  Episodes of hypoglycemia? No    Current insulin regiment: none Most Recent Eye Exam: not up to date Current exercise: none Current diet habits: not asked  Pertinent Labs: Lab Results  Component Value Date   CHOL 151 11/03/2019   HDL 48 11/03/2019   LDLCALC 90 11/03/2019   TRIG 65 11/03/2019   CHOLHDL 3.4 10/08/2018   Lab Results  Component Value Date   NA 136 08/25/2020   K 4.2 08/25/2020   CREATININE 0.90 08/25/2020   GFRNONAA >60 08/25/2020   GFRAA 109 11/03/2019   GLUCOSE 214 (H) 08/25/2020     ---------------------------------------------------------------------------------------------------   Patient Active Problem List   Diagnosis Date Noted  . Mild intermittent asthma without complication 06/05/2020  . Epidermal inclusion cyst 06/05/2020  . Indirect hyperbilirubinemia 06/05/2020  . Chronic pain of both shoulders 11/03/2019  . Hypertension associated with diabetes (HCC) 10/08/2018  . Hyperlipidemia associated with  type 2 diabetes mellitus (HCC) 10/08/2018  . Morbid obesity (HCC) 10/08/2018  . Chronic pain of left knee 07/07/2018  . Left carpal tunnel syndrome 07/07/2018  . 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 Allergic rhinitis 10/30/2015  . Diverticulosis 10/30/2015  . H/O: duodenal ulcer 10/30/2015  . T2DM (type 2 diabetes mellitus) (HCC) 10/30/2015  . Acute duodenal ulcer with hemorrhage 09/17/2007  . Compulsive tobacco user syndrome 04/22/2007  . Colon, diverticulosis 04/22/2007   Social History   Tobacco Use  . Smoking status: Never Smoker  . Smokeless tobacco: Current User    Types: Chew  . Tobacco comment: has quit smokeless tobacco in past 3x  Vaping Use  . Vaping Use: Never used  Substance Use Topics  . Alcohol use: Yes    Alcohol/week: 0.0 standard drinks    Comment: occasional  . Drug use: No   Allergies  Allergen Reactions  . Aspirin     G.I. bleeding  . Ibuprofen     G.I. bleeding       Medications: Outpatient Medications Prior to Visit  Medication Sig  . albuterol (VENTOLIN HFA) 108 (90 Base) MCG/ACT inhaler Inhale 2 puffs into the lungs every 6 (six) hours as needed for wheezing or shortness of breath.  04/24/2007 glucose blood test strip Check a fasting sugar in the morning and 2 hours after a meal 2 x week.  . levocetirizine (XYZAL) 5 MG tablet Take 5 mg by mouth every evening.  Marland Kitchen  metFORMIN (GLUCOPHAGE) 1000 MG tablet Take 1 tablet (1,000 mg total) by mouth 2 (two) times daily with a meal.  . methocarbamol (ROBAXIN) 750 MG tablet One or two 4 x day as needed for back spasms (Patient taking differently: Take 750-1,500 mg by mouth every 6 (six) hours as needed for muscle spasms.)  . Triamcinolone Acetonide (NASACORT AQ NA) Place 1 spray into the nose at bedtime.  . [DISCONTINUED] dapagliflozin propanediol (FARXIGA) 5 MG TABS tablet Take 1 tablet (5 mg total) by mouth daily before breakfast.  . [DISCONTINUED] acetaminophen (TYLENOL) 325 MG  tablet Take 2 tablets (650 mg total) by mouth every 8 (eight) hours as needed for mild pain.  . [DISCONTINUED] HYDROcodone-acetaminophen (NORCO) 5-325 MG tablet Take 1 tablet by mouth every 6 (six) hours as needed for up to 6 doses for moderate pain.   Facility-Administered Medications Prior to Visit  Medication Dose Route Frequency Provider  . betamethasone acetate-betamethasone sodium phosphate (CELESTONE) injection 3 mg  3 mg Intramuscular Once Gala Lewandowsky M, DPM  . betamethasone acetate-betamethasone sodium phosphate (CELESTONE) injection 3 mg  3 mg Intramuscular Once Felecia Shelling, DPM    Review of Systems  Constitutional: Negative.   Eyes: Negative.   Respiratory: Negative.   Cardiovascular: Negative.   Endocrine: Negative.         Objective    BP 134/84 (BP Location: Left Arm, Patient Position: Sitting, Cuff Size: Large)   Pulse 85   Temp 98.5 F (36.9 C) (Oral)   Resp 16   Wt 281 lb 11.2 oz (127.8 kg)   SpO2 96%   BMI 39.85 kg/m  BP Readings from Last 3 Encounters:  09/05/20 134/84  08/25/20 135/87  06/05/20 138/82   Wt Readings from Last 3 Encounters:  09/05/20 281 lb 11.2 oz (127.8 kg)  08/25/20 275 lb (124.7 kg)  08/22/20 275 lb (124.7 kg)      Physical Exam Vitals reviewed.  Constitutional:      General: He is not in acute distress.    Appearance: Normal appearance. He is not diaphoretic.  HENT:     Head: Normocephalic and atraumatic.  Eyes:     General: No scleral icterus.    Conjunctiva/sclera: Conjunctivae normal.  Cardiovascular:     Rate and Rhythm: Normal rate and regular rhythm.     Pulses: Normal pulses.     Heart sounds: Normal heart sounds. No murmur heard.   Pulmonary:     Effort: Pulmonary effort is normal. No respiratory distress.     Breath sounds: Normal breath sounds. No wheezing or rhonchi.  Abdominal:     General: There is no distension.     Palpations: Abdomen is soft.     Tenderness: There is no abdominal tenderness.   Musculoskeletal:     Cervical back: Neck supple.     Right lower leg: No edema.     Left lower leg: No edema.  Lymphadenopathy:     Cervical: No cervical adenopathy.  Skin:    General: Skin is warm and dry.     Capillary Refill: Capillary refill takes less than 2 seconds.     Findings: No rash.  Neurological:     Mental Status: He is alert and oriented to person, place, and time.     Cranial Nerves: No cranial nerve deficit.  Psychiatric:        Mood and Affect: Mood normal.        Behavior: Behavior normal.       Results  for orders placed or performed in visit on 09/05/20  POCT glycosylated hemoglobin (Hb A1C)  Result Value Ref Range   Hemoglobin A1C 8.0 (A) 4.0 - 5.6 %   Est. average glucose Bld gHb Est-mCnc 183     Assessment & Plan     Problem List Items Addressed This Visit      Cardiovascular and Mediastinum   Hypertension associated with diabetes (HCC)    Well controlled with diet, no meds Reviewed recent metabolic panel F/u in 3-6 months      Relevant Medications   dapagliflozin propanediol (FARXIGA) 10 MG TABS tablet     Endocrine   T2DM (type 2 diabetes mellitus) (HCC) - Primary    Uncontrolled with hyperglycemia Associated with hyperlipidemia and hypertension Continue Metformin at current dose Increase Farxiga to 10 mg daily Up-to-date on foot exam and vaccinations Cannot afford eye exam at this time Not on statin as above Also work on dietary changes Follow-up in 3 months and repeat A1c      Relevant Medications   dapagliflozin propanediol (FARXIGA) 10 MG TABS tablet   Other Relevant Orders   POCT glycosylated hemoglobin (Hb A1C) (Completed)   Hyperlipidemia associated with type 2 diabetes mellitus (HCC)    Not on statin - we have discussed benefits Will recheck CMP and FLP at next visit      Relevant Medications   dapagliflozin propanediol (FARXIGA) 10 MG TABS tablet     Other   Morbid obesity (HCC)    BMI 39 and associated with T2DM,  HTN, HLD Discussed importance of healthy weight management Discussed diet and exercise       Relevant Medications   dapagliflozin propanediol (FARXIGA) 10 MG TABS tablet       Return in about 3 months (around 12/06/2020) for CPE.      I, Shirlee Latch, MD, have reviewed all documentation for this visit. The documentation on 09/05/20 for the exam, diagnosis, procedures, and orders are all accurate and complete.   Steffani Dionisio, Marzella Schlein, MD, MPH [facility]

## 2020-09-04 NOTE — Patient Instructions (Signed)
Diabetes Mellitus and Nutrition, Adult When you have diabetes, or diabetes mellitus, it is very important to have healthy eating habits because your blood sugar (glucose) levels are greatly affected by what you eat and drink. Eating healthy foods in the right amounts, at about the same times every day, can help you:  Control your blood glucose.  Lower your risk of heart disease.  Improve your blood pressure.  Reach or maintain a healthy weight. What can affect my meal plan? Every person with diabetes is different, and each person has different needs for a meal plan. Your health care provider may recommend that you work with a dietitian to make a meal plan that is best for you. Your meal plan may vary depending on factors such as:  The calories you need.  The medicines you take.  Your weight.  Your blood glucose, blood pressure, and cholesterol levels.  Your activity level.  Other health conditions you have, such as heart or kidney disease. How do carbohydrates affect me? Carbohydrates, also called carbs, affect your blood glucose level more than any other type of food. Eating carbs naturally raises the amount of glucose in your blood. Carb counting is a method for keeping track of how many carbs you eat. Counting carbs is important to keep your blood glucose at a healthy level, especially if you use insulin or take certain oral diabetes medicines. It is important to know how many carbs you can safely have in each meal. This is different for every person. Your dietitian can help you calculate how many carbs you should have at each meal and for each snack. How does alcohol affect me? Alcohol can cause a sudden decrease in blood glucose (hypoglycemia), especially if you use insulin or take certain oral diabetes medicines. Hypoglycemia can be a life-threatening condition. Symptoms of hypoglycemia, such as sleepiness, dizziness, and confusion, are similar to symptoms of having too much  alcohol.  Do not drink alcohol if: ? Your health care provider tells you not to drink. ? You are pregnant, may be pregnant, or are planning to become pregnant.  If you drink alcohol: ? Do not drink on an empty stomach. ? Limit how much you use to:  0-1 drink a day for women.  0-2 drinks a day for men. ? Be aware of how much alcohol is in your drink. In the [place] one drink equals one 12 oz bottle of beer (355 mL), one 5 oz glass of wine (148 mL), or one 1 oz glass of hard liquor (44 mL). ? Keep yourself hydrated with water, diet soda, or unsweetened iced tea.  Keep in mind that regular soda, juice, and other mixers may contain a lot of sugar and must be counted as carbs. What are tips for following this plan? Reading food labels  Start by checking the serving size on the "Nutrition Facts" label of packaged foods and drinks. The amount of calories, carbs, fats, and other nutrients listed on the label is based on one serving of the item. Many items contain more than one serving per package.  Check the total grams (g) of carbs in one serving. You can calculate the number of servings of carbs in one serving by dividing the total carbs by 15. For example, if a food has 30 g of total carbs per serving, it would be equal to 2 servings of carbs.  Check the number of grams (g) of saturated fats and trans fats in one serving. Choose foods that have   a low amount or none of these fats.  Check the number of milligrams (mg) of salt (sodium) in one serving. Most people should limit total sodium intake to less than 2,300 mg per day.  Always check the nutrition information of foods labeled as "low-fat" or "nonfat." These foods may be higher in added sugar or refined carbs and should be avoided.  Talk to your dietitian to identify your daily goals for nutrients listed on the label. Shopping  Avoid buying canned, pre-made, or processed foods. These foods tend to be high in fat, sodium, and added  sugar.  Shop around the outside edge of the grocery store. This is where you will most often find fresh fruits and vegetables, bulk grains, fresh meats, and fresh dairy. Cooking  Use low-heat cooking methods, such as baking, instead of high-heat cooking methods like deep frying.  Cook using healthy oils, such as olive, canola, or sunflower oil.  Avoid cooking with butter, cream, or high-fat meats. Meal planning  Eat meals and snacks regularly, preferably at the same times every day. Avoid going long periods of time without eating.  Eat foods that are high in fiber, such as fresh fruits, vegetables, beans, and whole grains. Talk with your dietitian about how many servings of carbs you can eat at each meal.  Eat 4-6 oz (112-168 g) of lean protein each day, such as lean meat, chicken, fish, eggs, or tofu. One ounce (oz) of lean protein is equal to: ? 1 oz (28 g) of meat, chicken, or fish. ? 1 egg. ?  cup (62 g) of tofu.  Eat some foods each day that contain healthy fats, such as avocado, nuts, seeds, and fish.   What foods should I eat? Fruits Berries. Apples. Oranges. Peaches. Apricots. Plums. Grapes. Mango. Papaya. Pomegranate. Kiwi. Cherries. Vegetables Lettuce. Spinach. Leafy greens, including kale, chard, collard greens, and mustard greens. Beets. Cauliflower. Cabbage. Broccoli. Carrots. Green beans. Tomatoes. Peppers. Onions. Cucumbers. Brussels sprouts. Grains Whole grains, such as whole-wheat or whole-grain bread, crackers, tortillas, cereal, and pasta. Unsweetened oatmeal. Quinoa. Brown or wild rice. Meats and other proteins Seafood. Poultry without skin. Lean cuts of poultry and beef. Tofu. Nuts. Seeds. Dairy Low-fat or fat-free dairy products such as milk, yogurt, and cheese. The items listed above may not be a complete list of foods and beverages you can eat. Contact a dietitian for more information. What foods should I avoid? Fruits Fruits canned with  syrup. Vegetables Canned vegetables. Frozen vegetables with butter or cream sauce. Grains Refined white flour and flour products such as bread, pasta, snack foods, and cereals. Avoid all processed foods. Meats and other proteins Fatty cuts of meat. Poultry with skin. Breaded or fried meats. Processed meat. Avoid saturated fats. Dairy Full-fat yogurt, cheese, or milk. Beverages Sweetened drinks, such as soda or iced tea. The items listed above may not be a complete list of foods and beverages you should avoid. Contact a dietitian for more information. Questions to ask a health care provider  Do I need to meet with a diabetes educator?  Do I need to meet with a dietitian?  What number can I call if I have questions?  When are the best times to check my blood glucose? Where to find more information:  American Diabetes Association: diabetes.org  Academy of Nutrition and Dietetics: www.eatright.org  National Institute of Diabetes and Digestive and Kidney Diseases: www.niddk.nih.gov  Association of Diabetes Care and Education Specialists: www.diabeteseducator.org Summary  It is important to have healthy eating   habits because your blood sugar (glucose) levels are greatly affected by what you eat and drink.  A healthy meal plan will help you control your blood glucose and maintain a healthy lifestyle.  Your health care provider may recommend that you work with a dietitian to make a meal plan that is best for you.  Keep in mind that carbohydrates (carbs) and alcohol have immediate effects on your blood glucose levels. It is important to count carbs and to use alcohol carefully. This information is not intended to replace advice given to you by your health care provider. Make sure you discuss any questions you have with your health care provider. Document Revised: 05/25/2019 Document Reviewed: 05/25/2019 Elsevier Patient Education  2021 Elsevier Inc.  

## 2020-09-05 ENCOUNTER — Encounter: Payer: Self-pay | Admitting: Family Medicine

## 2020-09-05 ENCOUNTER — Ambulatory Visit
Admission: RE | Admit: 2020-09-05 | Discharge: 2020-09-05 | Disposition: A | Discharge status: Home / Self Care | Payer: BC Managed Care – PPO | Source: Ambulatory Visit | Attending: Surgery | Admitting: Surgery

## 2020-09-05 ENCOUNTER — Other Ambulatory Visit: Payer: Self-pay

## 2020-09-05 ENCOUNTER — Other Ambulatory Visit: Payer: Self-pay | Admitting: Surgery

## 2020-09-05 ENCOUNTER — Ambulatory Visit (INDEPENDENT_AMBULATORY_CARE_PROVIDER_SITE_OTHER): Payer: BC Managed Care – PPO | Admitting: Family Medicine

## 2020-09-05 VITALS — BP 134/84 | HR 85 | Temp 98.5°F | Resp 16 | Wt 281.7 lb

## 2020-09-05 DIAGNOSIS — E1169 Type 2 diabetes mellitus with other specified complication: Secondary | ICD-10-CM

## 2020-09-05 DIAGNOSIS — E1159 Type 2 diabetes mellitus with other circulatory complications: Secondary | ICD-10-CM

## 2020-09-05 DIAGNOSIS — K432 Incisional hernia without obstruction or gangrene: Secondary | ICD-10-CM | POA: Insufficient documentation

## 2020-09-05 DIAGNOSIS — Z9049 Acquired absence of other specified parts of digestive tract: Secondary | ICD-10-CM | POA: Diagnosis not present

## 2020-09-05 DIAGNOSIS — E785 Hyperlipidemia, unspecified: Secondary | ICD-10-CM

## 2020-09-05 DIAGNOSIS — I152 Hypertension secondary to endocrine disorders: Secondary | ICD-10-CM

## 2020-09-05 DIAGNOSIS — N3289 Other specified disorders of bladder: Secondary | ICD-10-CM | POA: Diagnosis not present

## 2020-09-05 DIAGNOSIS — K4090 Unilateral inguinal hernia, without obstruction or gangrene, not specified as recurrent: Secondary | ICD-10-CM | POA: Diagnosis not present

## 2020-09-05 DIAGNOSIS — N281 Cyst of kidney, acquired: Secondary | ICD-10-CM | POA: Diagnosis not present

## 2020-09-05 LAB — POCT GLYCOSYLATED HEMOGLOBIN (HGB A1C)
Est. average glucose Bld gHb Est-mCnc: 183
Hemoglobin A1C: 8 % — AB (ref 4.0–5.6)

## 2020-09-05 MED ORDER — IOHEXOL 300 MG/ML  SOLN
125.0000 mL | Freq: Once | INTRAMUSCULAR | Status: AC | PRN
  Administered 2020-09-05: 125 mL via INTRAVENOUS

## 2020-09-05 MED ORDER — DAPAGLIFLOZIN PROPANEDIOL 10 MG PO TABS: 10.0000 mg | ORAL_TABLET | Freq: Every day | ORAL | 5 refills | Status: AC

## 2020-09-05 NOTE — Assessment & Plan Note (Addendum)
Not on statin - we have discussed benefits Will recheck CMP and FLP at next visit

## 2020-09-05 NOTE — Assessment & Plan Note (Signed)
Well controlled with diet, no meds Reviewed recent metabolic panel F/u in 3-6 months

## 2020-09-05 NOTE — Assessment & Plan Note (Signed)
Uncontrolled with hyperglycemia Associated with hyperlipidemia and hypertension Continue Metformin at current dose Increase Farxiga to 10 mg daily Up-to-date on foot exam and vaccinations Cannot afford eye exam at this time Not on statin as above Also work on dietary changes Follow-up in 3 months and repeat A1c

## 2020-09-05 NOTE — Assessment & Plan Note (Signed)
BMI 39 and associated with T2DM, HTN, HLD Discussed importance of healthy weight management Discussed diet and exercise

## 2020-12-15 ENCOUNTER — Other Ambulatory Visit: Payer: Self-pay | Admitting: Family Medicine

## 2020-12-15 DIAGNOSIS — E1169 Type 2 diabetes mellitus with other specified complication: Secondary | ICD-10-CM

## 2021-01-02 ENCOUNTER — Encounter: Payer: Self-pay | Admitting: Family Medicine

## 2021-04-23 ENCOUNTER — Other Ambulatory Visit: Payer: Self-pay | Admitting: Family Medicine

## 2021-04-23 DIAGNOSIS — E1169 Type 2 diabetes mellitus with other specified complication: Secondary | ICD-10-CM

## 2021-04-23 NOTE — Telephone Encounter (Signed)
Pt notified to call and make an appt for his 6 mo. Check and refills.  I gave him a 30 day refill  on the metformin 1000 mg courtesy supply

## 2021-04-23 NOTE — Telephone Encounter (Signed)
Requested Prescriptions  Pending Prescriptions Disp Refills  . metFORMIN (GLUCOPHAGE) 1000 MG tablet [Pharmacy Med Name: METFORMIN 1000MG TABLETS] 60 tablet 0    Sig: TAKE 1 TABLET(1000 MG) BY MOUTH TWICE DAILY WITH A MEAL     Endocrinology:  Diabetes - Biguanides Failed - 04/23/2021  6:26 AM      Failed - HBA1C is between 0 and 7.9 and within 180 days    Hemoglobin A1C  Date Value Ref Range Status  09/05/2020 8.0 (A) 4.0 - 5.6 % Final   Hgb A1c MFr Bld  Date Value Ref Range Status  02/24/2019 6.9 (H) 4.8 - 5.6 % Final    Comment:             Prediabetes: 5.7 - 6.4          Diabetes: >6.4          Glycemic control for adults with diabetes: <7.0          Failed - Valid encounter within last 6 months    Recent Outpatient Visits          7 months ago Type 2 diabetes mellitus with other specified complication, without long-term current use of insulin [facility])   [facility] [place], Dionne Bucy, MD   10 months ago Type 2 diabetes mellitus with other specified complication, without long-term current use of insulin [facility])   [facility] [place], Dionne Bucy, MD   1 year ago Annual physical exam   [facility] [place], Dionne Bucy, MD   1 year ago Type 2 diabetes mellitus with other specified complication, without long-term current use of insulin [facility])   [place], Dionne Bucy, MD   2 years ago Diabetes mellitus without complication [facility])   [facility] [place], Adriana M, [place]             Passed - Cr in normal range and within 360 days    Creat  Date Value Ref Range Status  03/10/2017 0.86 0.70 - 1.33 mg/dL Final    Comment:    For patients >32 years of age, the reference limit for Creatinine is approximately 13% higher for people identified as African-American. .    Creatinine, Ser  Date Value Ref Range Status  08/25/2020 0.90 0.61 - 1.24 mg/dL Final         Passed - eGFR in normal range and  within 360 days    GFR calc Af Amer  Date Value Ref Range Status  11/03/2019 109 >59 mL/min/1.73 Final    Comment:    **[facility] currently reports eGFR in compliance with the current**   recommendations of the [employer]. [facility] will   update reporting as new guidelines are published from the NKF-ASN   Task force.    GFR, Estimated  Date Value Ref Range Status  08/25/2020 >60 >60 mL/min Final    Comment:    (NOTE) Calculated using the CKD-EPI Creatinine Equation (2021)

## 2021-05-03 DIAGNOSIS — Z125 Encounter for screening for malignant neoplasm of prostate: Secondary | ICD-10-CM | POA: Diagnosis not present

## 2021-05-03 DIAGNOSIS — M19012 Primary osteoarthritis, left shoulder: Secondary | ICD-10-CM | POA: Diagnosis not present

## 2021-05-03 DIAGNOSIS — G8929 Other chronic pain: Secondary | ICD-10-CM | POA: Diagnosis not present

## 2021-05-03 DIAGNOSIS — M25562 Pain in left knee: Secondary | ICD-10-CM | POA: Diagnosis not present

## 2021-05-03 DIAGNOSIS — M5126 Other intervertebral disc displacement, lumbar region: Secondary | ICD-10-CM | POA: Diagnosis not present

## 2021-05-03 DIAGNOSIS — E1165 Type 2 diabetes mellitus with hyperglycemia: Secondary | ICD-10-CM | POA: Diagnosis not present

## 2021-05-03 DIAGNOSIS — E785 Hyperlipidemia, unspecified: Secondary | ICD-10-CM | POA: Diagnosis not present

## 2021-05-03 DIAGNOSIS — M25512 Pain in left shoulder: Secondary | ICD-10-CM | POA: Diagnosis not present

## 2021-05-03 DIAGNOSIS — E1169 Type 2 diabetes mellitus with other specified complication: Secondary | ICD-10-CM | POA: Diagnosis not present

## 2021-05-03 DIAGNOSIS — Z0000 Encounter for general adult medical examination without abnormal findings: Secondary | ICD-10-CM | POA: Diagnosis not present

## 2021-05-04 ENCOUNTER — Other Ambulatory Visit: Payer: Self-pay | Admitting: Internal Medicine

## 2021-05-04 DIAGNOSIS — G8929 Other chronic pain: Secondary | ICD-10-CM

## 2021-05-13 ENCOUNTER — Ambulatory Visit
Admission: RE | Admit: 2021-05-13 | Discharge: 2021-05-13 | Disposition: A | Discharge status: Home / Self Care | Payer: BC Managed Care – PPO | Source: Ambulatory Visit | Attending: Internal Medicine | Admitting: Internal Medicine

## 2021-05-13 ENCOUNTER — Other Ambulatory Visit: Payer: Self-pay

## 2021-05-13 DIAGNOSIS — M25512 Pain in left shoulder: Secondary | ICD-10-CM | POA: Diagnosis not present

## 2021-05-13 DIAGNOSIS — M25412 Effusion, left shoulder: Secondary | ICD-10-CM | POA: Diagnosis not present

## 2021-05-13 DIAGNOSIS — G8929 Other chronic pain: Secondary | ICD-10-CM | POA: Diagnosis not present

## 2021-05-16 DIAGNOSIS — M25562 Pain in left knee: Secondary | ICD-10-CM | POA: Diagnosis not present

## 2021-05-16 DIAGNOSIS — G8929 Other chronic pain: Secondary | ICD-10-CM | POA: Diagnosis not present

## 2021-05-16 DIAGNOSIS — M1712 Unilateral primary osteoarthritis, left knee: Secondary | ICD-10-CM | POA: Diagnosis not present

## 2022-06-27 IMAGING — MR MR SHOULDER*L* W/O CM
4 of 5 series · 31 of 40 positions shown · non-contrast
Comparison: Left shoulder x-ray report dated May 03, 2021.

CLINICAL DATA: Chronic left shoulder pain and limited range of
motion. History of prior dislocation.

EXAM:
MRI OF THE LEFT SHOULDER WITHOUT CONTRAST
TECHNIQUE: Multiplanar, multisequence MR imaging of the shoulder was performed.
No intravenous contrast was administered.

[Series 5: T2 fat-sat · axial · left · 4.0mm · 0.44mm/px · z∈[-57,+62]mm · 8 of 26 slices shown (1 of 3)]
[im 1/26]
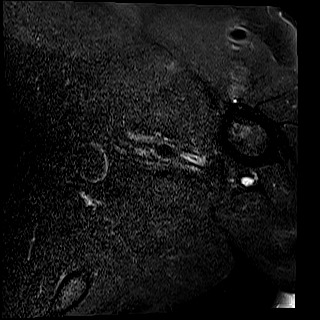
[im 4/26]
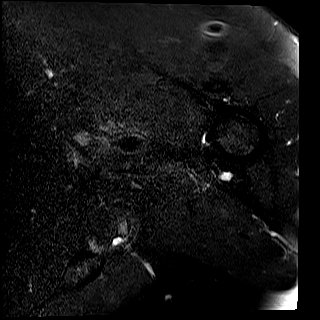
[im 8/26]
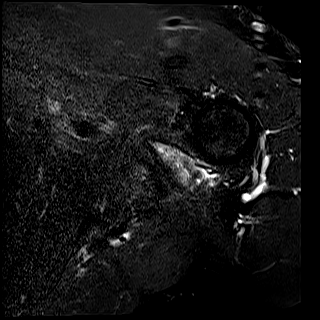
[im 11/26]
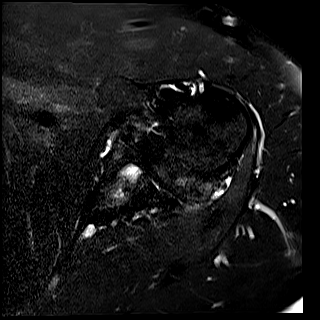
[im 15/26]
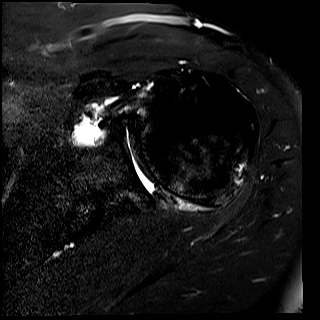
[im 18/26]
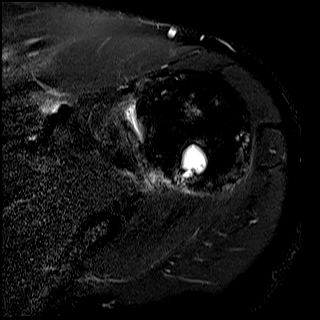
[im 22/26]
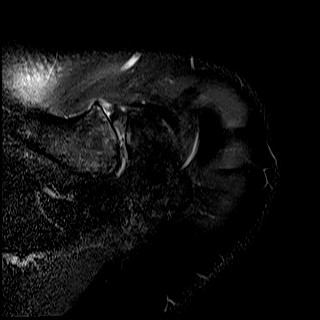
[im 26/26]
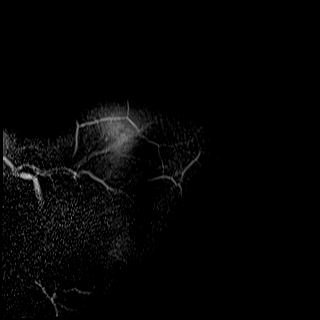

[Series 6: PD · oblique · left · 4.0mm · 0.44mm/px · 9 of 26 slices shown]
[im 1/26]
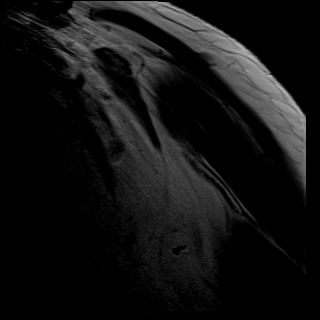
[im 4/26]
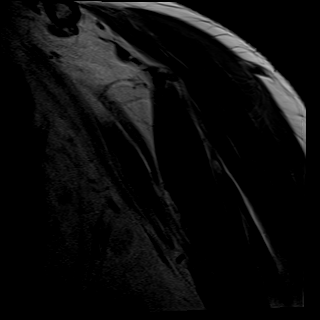
[im 7/26]
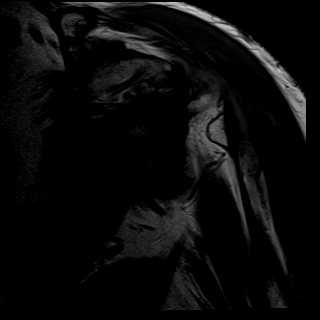
[im 10/26]
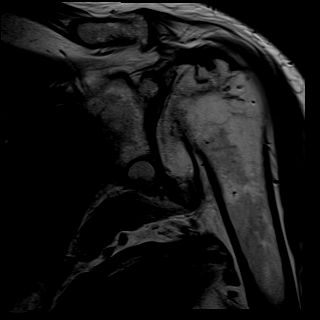
[im 13/26]
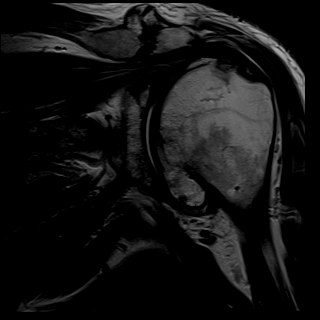
[im 16/26]
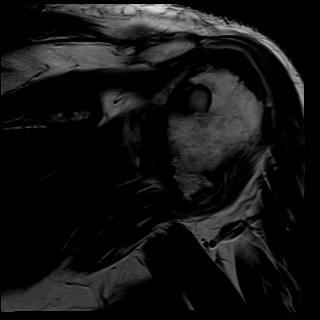
[im 19/26]
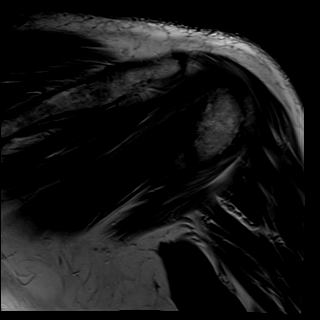
[im 22/26]
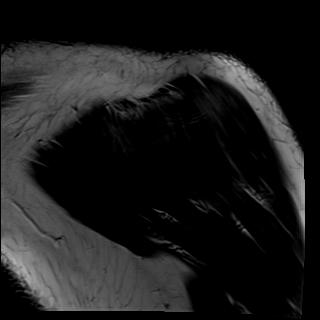
[im 26/26]
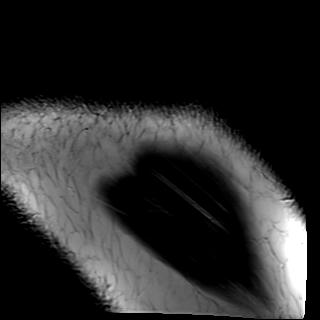

[Series 7: T2 fat-sat · oblique · left · 4.0mm · 0.44mm/px · 9 of 26 slices shown (2 of 3)]
[im 1/26]
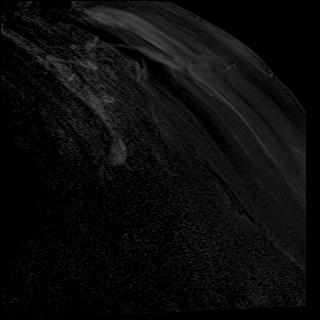
[im 4/26]
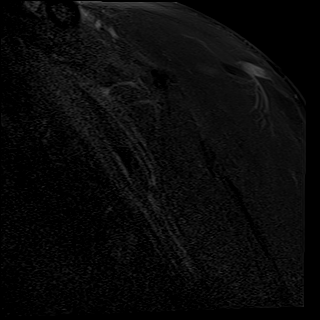
[im 7/26]
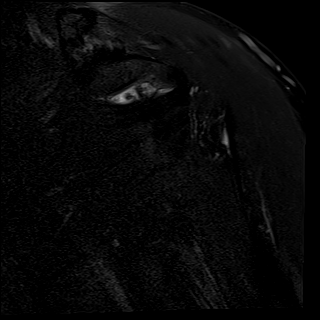
[im 10/26]
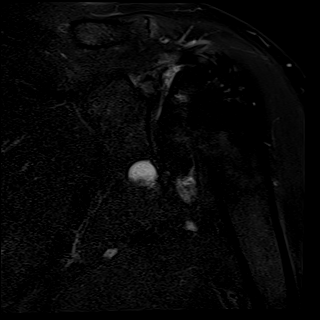
[im 13/26]
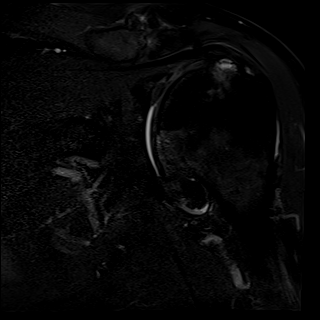
[im 16/26]
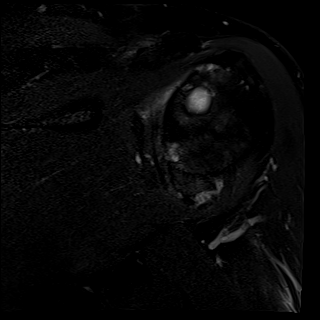
[im 19/26]
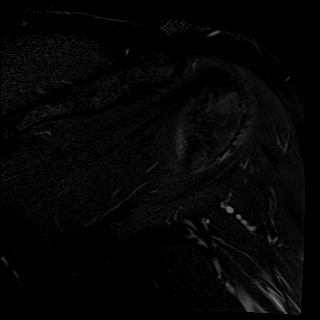
[im 22/26]
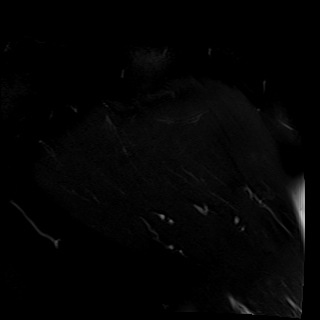
[im 26/26]
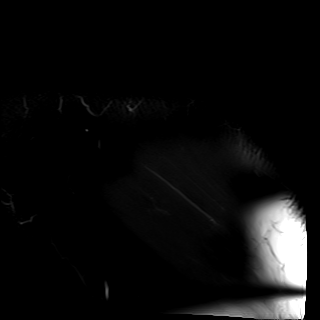

[Series 8: T2 fat-sat · oblique · left · 4.0mm · 0.22mm/px · 5 of 22 slices shown (3 of 3)]
[im 1/22]
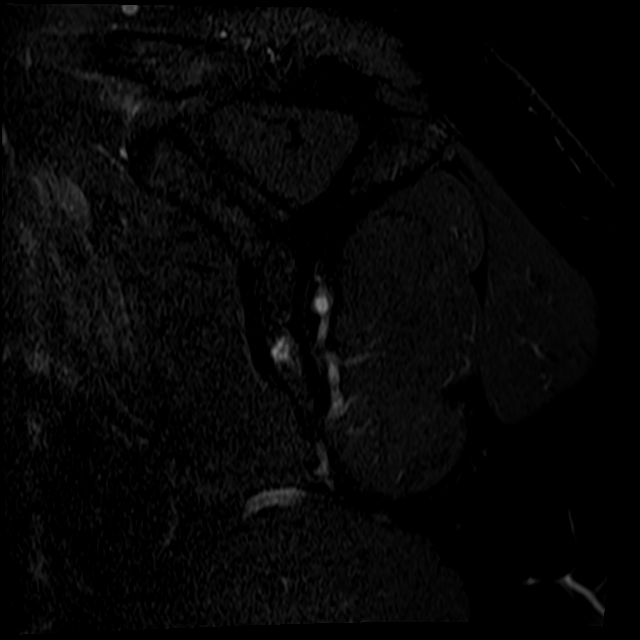
[im 4/22]
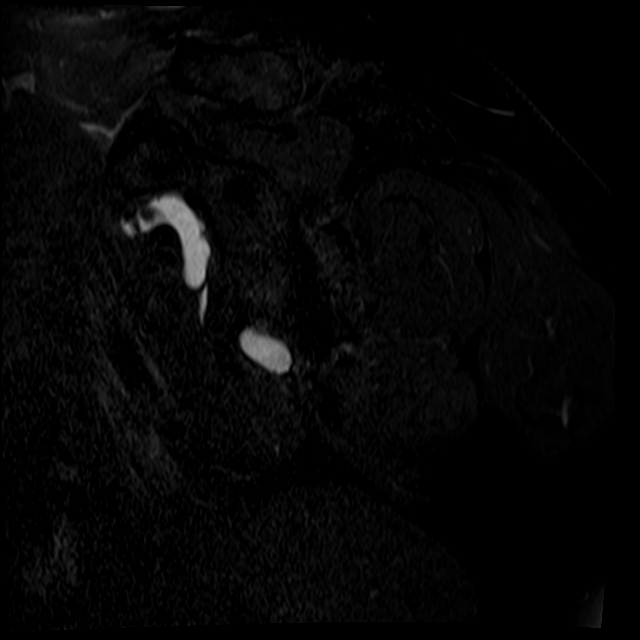
[im 8/22]
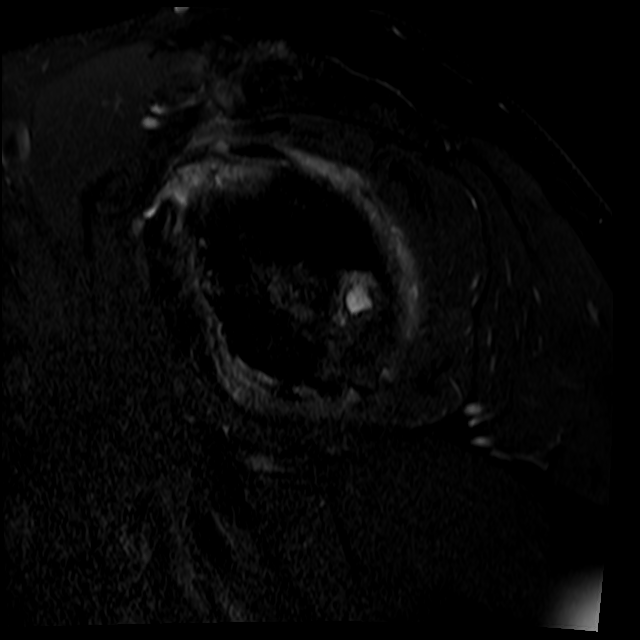
[im 11/22]
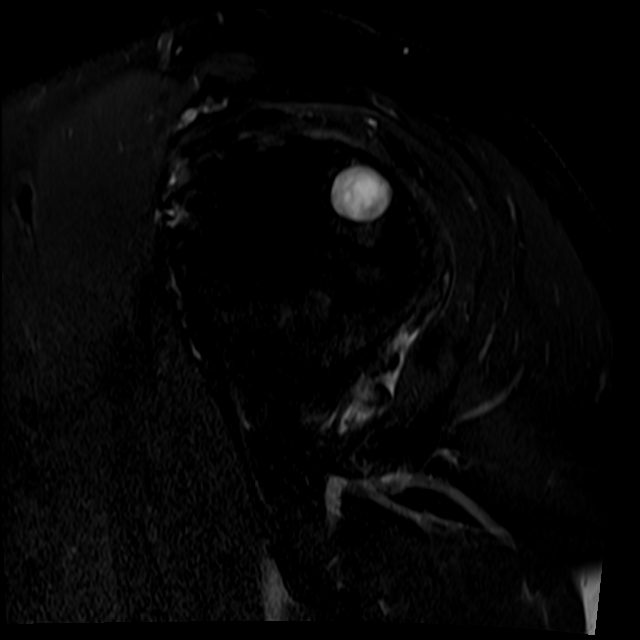
[im 18/22]
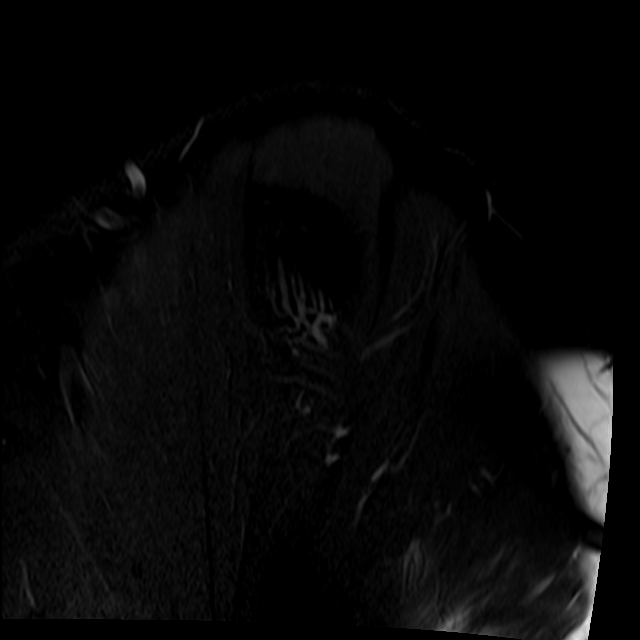

[31 of 40 positions shown; findings below may reference images not displayed]

FINDINGS: Rotator cuff: Mild to moderate supraspinatus and subscapularis
tendinosis with small intrasubstance tears at their insertions. Mild
distal infraspinatus tendinosis. The teres minor tendon is
unremarkable.

Muscles: No atrophy or abnormal signal of the muscles of the rotator
cuff.

Biceps long head: Intact and normally positioned. Mild
intra-articular tendinosis.

Acromioclavicular Joint: Moderate arthropathy of the
acromioclavicular joint. Type II acromion. No subacromial/subdeltoid
bursal fluid.

Glenohumeral Joint: Small joint effusion. Scattered full-thickness
cartilage loss over the glenoid and medial humeral head with
eburnation, subchondral cystic change, and large osteophytes.

Labrum:  Diffusely degenerated and torn.

Bones: No acute fracture or dislocation. No suspicious bone lesion.

Other: None.
IMPRESSION: 1. Severe glenohumeral osteoarthritis.
2. Mild to moderate supraspinatus and subscapularis tendinosis with
small intrasubstance tears at their insertions. No high-grade
rotator cuff tear.
3. Mild intra-articular biceps tendinosis.
4. Moderate acromioclavicular osteoarthritis.
